# Patient Record
Sex: Female | Born: 1945 | Race: White | Hispanic: No | Smoking: Former smoker
Health system: Southern US, Community
[De-identification: ages and names within clinical notes are randomized; demographics above are authoritative.]

## PROBLEM LIST (undated history)

## (undated) DIAGNOSIS — E079 Disorder of thyroid, unspecified: Secondary | ICD-10-CM

## (undated) DIAGNOSIS — I209 Angina pectoris, unspecified: Secondary | ICD-10-CM

## (undated) DIAGNOSIS — I10 Essential (primary) hypertension: Secondary | ICD-10-CM

## (undated) DIAGNOSIS — E785 Hyperlipidemia, unspecified: Secondary | ICD-10-CM

## (undated) DIAGNOSIS — Z8719 Personal history of other diseases of the digestive system: Secondary | ICD-10-CM

## (undated) DIAGNOSIS — M1990 Unspecified osteoarthritis, unspecified site: Secondary | ICD-10-CM

## (undated) DIAGNOSIS — T8859XA Other complications of anesthesia, initial encounter: Secondary | ICD-10-CM

## (undated) DIAGNOSIS — I2510 Atherosclerotic heart disease of native coronary artery without angina pectoris: Secondary | ICD-10-CM

## (undated) DIAGNOSIS — Z720 Tobacco use: Secondary | ICD-10-CM

## (undated) HISTORY — PX: BREAST LUMPECTOMY: SHX2

## (undated) HISTORY — PX: CHOLECYSTECTOMY: SHX55

## (undated) HISTORY — PX: TONSILLECTOMY: SUR1361

## (undated) HISTORY — PX: TUBAL LIGATION: SHX77

---

## 1968-06-24 DIAGNOSIS — E079 Disorder of thyroid, unspecified: Secondary | ICD-10-CM

## 1968-06-24 HISTORY — DX: Disorder of thyroid, unspecified: E07.9

## 1981-06-24 HISTORY — PX: TUBAL LIGATION: SHX77

## 1986-06-24 HISTORY — PX: BREAST EXCISIONAL BIOPSY: SUR124

## 1992-06-24 HISTORY — PX: CHOLECYSTECTOMY: SHX55

## 2012-10-20 ENCOUNTER — Encounter (HOSPITAL_COMMUNITY): Payer: Self-pay | Admitting: Emergency Medicine

## 2012-10-20 ENCOUNTER — Emergency Department (HOSPITAL_COMMUNITY): Payer: Medicare Other

## 2012-10-20 ENCOUNTER — Observation Stay (HOSPITAL_COMMUNITY)
Admission: EM | Admit: 2012-10-20 | Discharge: 2012-10-22 | Disposition: A | Discharge status: Home / Self Care | Payer: Medicare Other | Attending: Internal Medicine | Admitting: Internal Medicine

## 2012-10-20 ENCOUNTER — Emergency Department (INDEPENDENT_AMBULATORY_CARE_PROVIDER_SITE_OTHER)
Admission: EM | Admit: 2012-10-20 | Discharge: 2012-10-20 | Disposition: A | Discharge status: Nursing Home (Includes ICF) | Payer: Medicare Other | Source: Home / Self Care | Attending: Family Medicine | Admitting: Family Medicine

## 2012-10-20 DIAGNOSIS — I2510 Atherosclerotic heart disease of native coronary artery without angina pectoris: Secondary | ICD-10-CM | POA: Diagnosis not present

## 2012-10-20 DIAGNOSIS — Z8249 Family history of ischemic heart disease and other diseases of the circulatory system: Secondary | ICD-10-CM | POA: Diagnosis not present

## 2012-10-20 DIAGNOSIS — R079 Chest pain, unspecified: Secondary | ICD-10-CM | POA: Diagnosis not present

## 2012-10-20 DIAGNOSIS — J449 Chronic obstructive pulmonary disease, unspecified: Secondary | ICD-10-CM | POA: Diagnosis not present

## 2012-10-20 DIAGNOSIS — J4489 Other specified chronic obstructive pulmonary disease: Secondary | ICD-10-CM | POA: Insufficient documentation

## 2012-10-20 DIAGNOSIS — E785 Hyperlipidemia, unspecified: Secondary | ICD-10-CM | POA: Insufficient documentation

## 2012-10-20 DIAGNOSIS — E669 Obesity, unspecified: Secondary | ICD-10-CM | POA: Diagnosis not present

## 2012-10-20 DIAGNOSIS — E049 Nontoxic goiter, unspecified: Secondary | ICD-10-CM | POA: Diagnosis not present

## 2012-10-20 DIAGNOSIS — I200 Unstable angina: Secondary | ICD-10-CM | POA: Diagnosis not present

## 2012-10-20 DIAGNOSIS — F17200 Nicotine dependence, unspecified, uncomplicated: Secondary | ICD-10-CM | POA: Diagnosis not present

## 2012-10-20 DIAGNOSIS — Z6835 Body mass index (BMI) 35.0-35.9, adult: Secondary | ICD-10-CM | POA: Diagnosis not present

## 2012-10-20 DIAGNOSIS — Z955 Presence of coronary angioplasty implant and graft: Secondary | ICD-10-CM

## 2012-10-20 DIAGNOSIS — I10 Essential (primary) hypertension: Secondary | ICD-10-CM | POA: Insufficient documentation

## 2012-10-20 HISTORY — DX: Tobacco use: Z72.0

## 2012-10-20 HISTORY — DX: Disorder of thyroid, unspecified: E07.9

## 2012-10-20 HISTORY — DX: Atherosclerotic heart disease of native coronary artery without angina pectoris: I25.10

## 2012-10-20 HISTORY — DX: Angina pectoris, unspecified: I20.9

## 2012-10-20 HISTORY — DX: Hyperlipidemia, unspecified: E78.5

## 2012-10-20 LAB — CBC
MCV: 86 fL (ref 78.0–100.0)
Platelets: 300 10*3/uL (ref 150–400)
RBC: 5.2 MIL/uL — ABNORMAL HIGH (ref 3.87–5.11)
WBC: 6.9 10*3/uL (ref 4.0–10.5)

## 2012-10-20 LAB — LIPID PANEL
Cholesterol: 216 mg/dL — ABNORMAL HIGH (ref 0–200)
Triglycerides: 202 mg/dL — ABNORMAL HIGH (ref <150)
VLDL: 40 mg/dL (ref 0–40)

## 2012-10-20 LAB — POCT I-STAT TROPONIN I: Troponin i, poc: 0 ng/mL (ref 0.00–0.08)

## 2012-10-20 LAB — BASIC METABOLIC PANEL
CO2: 28 mEq/L (ref 19–32)
Chloride: 100 mEq/L (ref 96–112)
GFR calc Af Amer: >90 mL/min (ref >90)
Potassium: 4.4 mEq/L (ref 3.5–5.1)
Sodium: 138 mEq/L (ref 135–145)

## 2012-10-20 LAB — TROPONIN I: Troponin I: <0.3 ng/mL (ref <0.30)

## 2012-10-20 MED ORDER — ENOXAPARIN SODIUM 40 MG/0.4ML ~~LOC~~ SOLN
40.0000 mg | SUBCUTANEOUS | Status: DC
  Administered 2012-10-20: 40 mg via SUBCUTANEOUS
  Filled 2012-10-20 (×2): qty 0.4

## 2012-10-20 MED ORDER — SODIUM CHLORIDE 0.9 % IJ SOLN
3.0000 mL | Freq: Two times a day (BID) | INTRAMUSCULAR | Status: DC
  Administered 2012-10-20: 3 mL via INTRAVENOUS

## 2012-10-20 MED ORDER — ASPIRIN EC 81 MG PO TBEC
81.0000 mg | DELAYED_RELEASE_TABLET | Freq: Every day | ORAL | Status: DC
  Administered 2012-10-22: 81 mg via ORAL
  Filled 2012-10-20 (×3): qty 1

## 2012-10-20 MED ORDER — ASPIRIN 81 MG PO CHEW
324.0000 mg | CHEWABLE_TABLET | Freq: Once | ORAL | Status: AC
  Administered 2012-10-20: 324 mg via ORAL
  Filled 2012-10-20: qty 1
  Filled 2012-10-20: qty 4

## 2012-10-20 MED ORDER — SODIUM CHLORIDE 0.9 % IV SOLN: 250.0000 mL | INTRAVENOUS | Status: DC | PRN

## 2012-10-20 MED ORDER — NITROGLYCERIN 0.4 MG SL SUBL: 0.4000 mg | SUBLINGUAL_TABLET | SUBLINGUAL | Status: DC | PRN

## 2012-10-20 MED ORDER — ONDANSETRON HCL 4 MG PO TABS: 4.0000 mg | ORAL_TABLET | Freq: Four times a day (QID) | ORAL | Status: DC | PRN

## 2012-10-20 MED ORDER — ATORVASTATIN CALCIUM 80 MG PO TABS
80.0000 mg | ORAL_TABLET | Freq: Every day | ORAL | Status: DC
  Administered 2012-10-20 – 2012-10-21 (×2): 80 mg via ORAL
  Filled 2012-10-20 (×4): qty 1

## 2012-10-20 MED ORDER — SODIUM CHLORIDE 0.9 % IJ SOLN
3.0000 mL | Freq: Two times a day (BID) | INTRAMUSCULAR | Status: DC
  Administered 2012-10-21: 3 mL via INTRAVENOUS

## 2012-10-20 MED ORDER — PANTOPRAZOLE SODIUM 40 MG PO TBEC: 40.0000 mg | DELAYED_RELEASE_TABLET | Freq: Every day | ORAL | Status: DC | PRN

## 2012-10-20 MED ORDER — SODIUM CHLORIDE 0.9 % IJ SOLN: 3.0000 mL | INTRAMUSCULAR | Status: DC | PRN

## 2012-10-20 MED ORDER — ONDANSETRON HCL 4 MG/2ML IJ SOLN: 4.0000 mg | Freq: Four times a day (QID) | INTRAMUSCULAR | Status: DC | PRN

## 2012-10-20 MED ORDER — MORPHINE SULFATE 2 MG/ML IJ SOLN: 1.0000 mg | INTRAMUSCULAR | Status: DC | PRN

## 2012-10-20 NOTE — ED Notes (Signed)
Pt c/o mid sternal CP; pt sent here for eval from [facility]: pt denies SOB or nausea

## 2012-10-20 NOTE — ED Notes (Signed)
MD at bedside. 

## 2012-10-20 NOTE — ED Provider Notes (Signed)
History     CSN: [id number]  Arrival date & time 10/20/12  1437   First MD Initiated Contact with Patient 10/20/12 1616      Chief Complaint  Patient presents with  . Chest Pain    (Consider location/radiation/quality/duration/timing/severity/associated sxs/prior treatment) HPI Comments: Patient presents from urgent care with a two-week history of intermittent substernal chest pain that radiates to her back and left arm. The pain comes and goes sometimes lasting for hours to days at a time. Is not sure, with exertion. Denies shortness of breath, cough or fever. No history of hypertension or diabetes. She is a smoker. Denies any cardiac history. Has never had a stress test. Does not have a regular doctor. Sibling had bypass at age 56. Nothing makes the pain better or worse. She was able to mow the grass and not have any chest pain.  The history is provided by the patient.    History reviewed. No pertinent past medical history.  History reviewed. No pertinent past surgical history.  History reviewed. No pertinent family history.  History  Substance Use Topics  . Smoking status: Current Every Day Smoker -- 1.00 packs/day for 40 years    Types: Cigarettes  . Smokeless tobacco: Not on file  . Alcohol Use: No    OB History   Grav Para Term Preterm Abortions TAB SAB Ect Mult Living                  Review of Systems  Constitutional: Negative for fever, activity change and appetite change.  Respiratory: Positive for chest tightness. Negative for cough and shortness of breath.   Cardiovascular: Positive for chest pain.  Gastrointestinal: Negative for nausea, vomiting and abdominal pain.  Genitourinary: Negative for dysuria and hematuria.  Musculoskeletal: Negative for back pain.  Skin: Negative for rash.  Neurological: Negative for dizziness and headaches.  A complete 10 system review of systems was obtained and all systems are negative except as noted in the HPI and PMH.     Allergies  Codeine  Home Medications   Current Outpatient Rx  Name  Route  Sig  Dispense  Refill  . naproxen sodium (ANAPROX) 220 MG tablet   Oral   Take 220 mg by mouth daily as needed. For pain           BP 150/92  Pulse 62  Temp(Src) 98 F (36.7 C) (Oral)  Resp 18  SpO2 98%  Physical Exam  Constitutional: She is oriented to person, place, and time. She appears well-developed and well-nourished. No distress.  HENT:  Head: Normocephalic and atraumatic.  Mouth/Throat: Oropharynx is clear and moist. No oropharyngeal exudate.  Eyes: Conjunctivae and EOM are normal. Pupils are equal, round, and reactive to light.  Neck: Normal range of motion. Neck supple.  Cardiovascular: Normal rate, regular rhythm, normal heart sounds and intact distal pulses.   No murmur heard. Equal peripheral pulses  Pulmonary/Chest: Effort normal and breath sounds normal. No respiratory distress.  Abdominal: Soft. There is no tenderness. There is no rebound and no guarding.  Musculoskeletal: Normal range of motion. She exhibits no edema and no tenderness.  Neurological: She is alert and oriented to person, place, and time. No cranial nerve deficit. She exhibits normal muscle tone. Coordination normal.  Skin: Skin is warm.    ED Course  Procedures (including critical care time)  Labs Reviewed  CBC - Abnormal; Notable for the following:    RBC 5.20 (*)    Hemoglobin 16.0 (*)  All other components within normal limits  BASIC METABOLIC PANEL - Abnormal; Notable for the following:    GFR calc non Af Amer 89 (*)    All other components within normal limits  POCT I-STAT TROPONIN I   Dg Chest 2 View  10/20/2012  *RADIOLOGY REPORT*  Clinical Data: Chest pain.  CHEST - 2 VIEW  Comparison: None  Findings: The cardiomediastinal silhouette is unremarkable. Mild peribronchial thickening / COPD is noted. There is no evidence of focal airspace disease, pulmonary edema, suspicious pulmonary  nodule/mass, pleural effusion, or pneumothorax. No acute bony abnormalities are identified.  IMPRESSION: No evidence of acute cardiopulmonary disease.  COPD.   Original Report Authenticated By: Harmon Pier, M.D.      1. Chest pain       MDM  2 weeks of intermittent substernal chest pain it radiates to the back and the arm. No cardiac history. Never had a stress test. Female gender with history of smoking and family history of early CAD.  EKG normal sinus rhythm. Troponin negative. Low suspicion for PE or dissection.  Concern for ACS based on age and risk factors. We'll admit to internal medicine service as unassigned patient   Date: 10/20/2012  Rate: 77  Rhythm: normal sinus rhythm  QRS Axis: normal  Intervals: normal  ST/T Wave abnormalities: normal  Conduction Disutrbances:none  Narrative Interpretation:   Old EKG Reviewed: none available        Glynn Octave, MD 10/20/12 1709

## 2012-10-20 NOTE — H&P (Signed)
Hospital Admission Note Date: 10/20/2012  Patient name: Cassandra Castillo Medical record number: [id number] Date of birth: [birth year] Age: 67 y.o. Gender: female PCP: No primary provider on file.  Medical Service: B [facility]  Attending physician:  Dr. Rogelia Boga  1st Contact:  Dr. Collier Bullock  Pager: [phone] 2nd Contact:  Dr. Dorise Hiss  Pager:[phone] After 5 pm or weekends: 1st Contact:      Pager: [phone] 2nd Contact:      Pager: [phone]  Chief Complaint: Chest pain/discomfort  History of Present Illness: The patient is a 67 year old lady with a history of hyperlipidemia, benign breast mass removed in 1992, and no interaction with the medical community in over 10 years, who presents with 2 weeks of chest pain/discomfort. She has not noticed any associated nausea, vomiting, shortness of breath, diaphoresis. There is no association with activity levels as the pain can occur at rest or at any random time. She was able to mow the grass a few days ago without any chest pressure at all. Patient describes the pain as "dull," "tightness" and "pressure" that is located across the front of her chest and radiates to her back and shoulder blades. Sometimes radiates to the left side. Patient feels like this pain is worsening and has become more frequent over the last 2 weeks. Yesterday she states that the pain was almost constant. Patient has tried Aleve for this problem without any relief. Patient does endorse several episodes of palpitations.  Patient has also noticed worsening indigestion over the past couple of weeks with burning epigastric pain and chest pain. She has tried Scientist, research (medical) for this problem and she states that the pain improves after using them. She feels this pain is different than the chest pain and she has been having.  Patient does not have a PCP and has not had regular medical care in over 10 years. She states that she doesn't like doctors and doesn't like taking medicines.  Family history significant  for a father who had severe coronary artery disease, had a four-vessel CABG at age 40 and a second 2 vessel CABG at age 63. He died of congestive heart failure 10 years After the CABG. Patient also has an uncle who had severe coronary artery disease also.  Patient smokes one pack per day and has done so for about 40 years. She denies any alcohol use.  Patient denies any dysuria, urinary frequency, diarrhea, recent illness or URI.  Meds: Current Outpatient Rx  Name  Route  Sig  Dispense  Refill  . naproxen sodium (ANAPROX) 220 MG tablet   Oral   Take 220 mg by mouth daily as needed. For pain           Allergies: Allergies as of 10/20/2012 - Review Complete 10/20/2012  Allergen Reaction Noted  . Codeine Nausea And Vomiting 10/20/2012   Past Medical History  Diagnosis Date  . S/P tonsillectomy   . S/P cholecystectomy   . S/P lumpectomy, left breast   . Hyperlipidemia   . S/P tubal ligation    History reviewed. No pertinent past surgical history. History reviewed. No pertinent family history. History   Social History  . Marital Status: Married    Spouse Name: N/A    Number of Children: N/A  . Years of Education: N/A   Occupational History  . Not on file.   Social History Main Topics  . Smoking status: Current Every Day Smoker -- 1.00 packs/day for 40 years    Types: Cigarettes  .  Smokeless tobacco: Not on file  . Alcohol Use: No  . Drug Use: No  . Sexually Active: Not on file   Other Topics Concern  . Not on file   Social History Narrative  . No narrative on file    Review of Systems: Pertinent items are noted in HPI.  Physical Exam Blood pressure 150/92, pulse 62, temperature 98 F (36.7 C), temperature source Oral, resp. rate 18, SpO2 98.00%. General:  Obese, No acute distress, alert and oriented x 3 HEENT:  PERRL, EOMI, moist mucous membranes, poor dentition, several missing teeth, oropharynx otherwise clear, no lesions Cardiovascular:  Regular rate  and rhythm, no murmurs, rubs or gallops. No carotid bruits heard Chest wall: Anterior chest wall is tender to palpation, feels different than her usual chest pain. Respiratory:  Clear to auscultation bilaterally, no wheezes, rales, or rhonchi, decreased breath sounds at the bases Abdomen:  Soft, nondistended, minimal tenderness in the right upper quadrant and epigastrium, bowel sounds present, no masses, no hepatosplenomegaly appreciated Extremities:  Warm and well-perfused, trace bilateral lower extremity edema.  Skin: Warm, dry, no rashes Neuro: Not anxious appearing, no depressed mood, normal affect    Lab results: Basic Metabolic Panel:  Recent Labs  16/10/96 1511  NA 138  K 4.4  CL 100  CO2 28  GLUCOSE 89  BUN 7  CREATININE 0.69  CALCIUM 9.8   CBC:  Recent Labs  10/20/12 1511  WBC 6.9  HGB 16.0*  HCT 44.7  MCV 86.0  PLT 300    Imaging results:  Dg Chest 2 View  10/20/2012  *RADIOLOGY REPORT*  Clinical Data: Chest pain.  CHEST - 2 VIEW  Comparison: None  Findings: The cardiomediastinal silhouette is unremarkable. Mild peribronchial thickening / COPD is noted. There is no evidence of focal airspace disease, pulmonary edema, suspicious pulmonary nodule/mass, pleural effusion, or pneumothorax. No acute bony abnormalities are identified.  IMPRESSION: No evidence of acute cardiopulmonary disease.  COPD.   Original Report Authenticated By: Harmon Pier, M.D.      Assessment & Plan by Problem: Active Problems:   Chest pain   Chest pain Patient presents with 2 weeks of chest pain, initial troponins negative, chest x-ray with mild peribronchial thickening but no acute abnormalities. EKG without acute ST elevations or depressions, no contiguous T-wave inversions. TIMI score of 4, correlating with a 20% risk of acute event in the next 30 days. Modified Geneva score is 1, low risk, wells score is 0. Patient does has risk factors including family history of CAD, smoking, and  possibly hypertension and hyperlipidemia though not documented. Has not seen an MD for preventative care in 20 years. Differential includes ACS, PE, musculoskeletal pain, anxiety, GERD, esophageal spasm, possible gastritis. Less likely PNA as CXR neg and no cough, fever, SOB. Another consideration is breast cancer as patient has hx of "benign" lump removed from breast without any follow up or mammograms. Dissection less likely as no widened mediastinum on CXR, equal pulses, and not acute pain. No recent vomiting to suggest Boerhaave's. No epigastric pain or nausea, so less likely pancreatitis. Will need risk stratification and establishing PCP.  -Admit to telemetry for observation -Daily aspirin, morphine PRN, oxygen PRN, NTG prn -Repeat EKG in the morning -Will call cardiology to evaluate patient for possible stress test -Will start Protonix 40 mg daily for indigestion -CMP, hemoglobin A1c, lipid panel, TSH -Cycle troponins overnight  DVT prophylaxis Lovenox  Diet N.p.o. after midnight, otherwise heart healthy diet  Disposition Anticipate discharge  in 1-2 days  Patient will need to establish care with a PCP and get routine medical care including mammogram, colonoscopy, blood pressure f/u. Patient does not have a PCP and may need OPC followup           Dispo: Disposition is deferred at this time, awaiting improvement of current medical problems. Anticipated discharge in approximately 1-2 day(s).   The patient does not have a current PCP, therefore may be requiring OPC follow-up after discharge.   The patient does not have transportation limitations that hinder transportation to clinic appointments.  SignedGenella Mech 10/20/2012, 6:20 PM

## 2012-10-20 NOTE — ED Notes (Signed)
Pt c/o chest pain x 2 weeks. Pain comes and goes and radiates out to chest and back. Has taken Aleeve with no relief. Last took Aleeve last night around 1130 pm. Pt is alert and oriented.

## 2012-10-20 NOTE — ED Provider Notes (Signed)
History     CSN: [id number]  Arrival date & time 10/20/12  1300   First MD Initiated Contact with Patient 10/20/12 1356      Chief Complaint  Patient presents with  . Chest Pain    (Consider location/radiation/quality/duration/timing/severity/associated sxs/prior treatment) Patient is a 67 y.o. female presenting with chest pain. The history is provided by the patient.  Chest Pain Pain location:  L chest Pain quality: aching and radiating   Pain radiates to:  L shoulder Pain radiates to the back: yes   Pain severity:  Moderate Onset quality:  Gradual Timing:  Intermittent Progression:  Waxing and waning Relieved by:  Nothing Worsened by:  Nothing tried Ineffective treatments:  None tried Associated symptoms: no abdominal pain   Pt has had chest pain on and off for 2 weeks.  Pain goes through to back.  Pt had pain until approx 20 minutes ago.  No regular MD.  No history of htn or diabetes  History reviewed. No pertinent past medical history.  History reviewed. No pertinent past surgical history.  No family history on file.  History  Substance Use Topics  . Smoking status: Current Every Day Smoker -- 1.00 packs/day for 40 years    Types: Cigarettes  . Smokeless tobacco: Not on file  . Alcohol Use: No    OB History   Grav Para Term Preterm Abortions TAB SAB Ect Mult Living                  Review of Systems  Cardiovascular: Positive for chest pain.  Gastrointestinal: Negative for abdominal pain.  All other systems reviewed and are negative.    Allergies  Codeine  Home Medications  No current outpatient prescriptions on file.  BP 162/80  Pulse 74  Temp(Src) 97.5 F (36.4 C) (Oral)  Resp 12  SpO2 97%  Physical Exam  Nursing note and vitals reviewed. Constitutional: She is oriented to person, place, and time. She appears well-developed and well-nourished.  HENT:  Head: Normocephalic and atraumatic.  Right Ear: External ear normal.  Left Ear:  External ear normal.  Nose: Nose normal.  Mouth/Throat: Oropharynx is clear and moist.  Eyes: Conjunctivae and EOM are normal. Pupils are equal, round, and reactive to light.  Neck: Normal range of motion. Neck supple.  Cardiovascular: Normal rate and normal heart sounds.   Pulmonary/Chest: Effort normal and breath sounds normal.  Abdominal: Soft. Bowel sounds are normal.  Musculoskeletal: Normal range of motion.  Neurological: She is alert and oriented to person, place, and time. She has normal reflexes.  Skin: Skin is warm.  Psychiatric: She has a normal mood and affect.    ED Course  Procedures (including critical care time)  Labs Reviewed - No data to display No results found.   1. Chest pain       MDM   Date: 10/20/2012  Rate: 61  Rhythm: normal sinus rhythm  QRS Axis: normal  Intervals: normal  ST/T Wave abnormalities: normal  Conduction Disutrbances:none  Narrative Interpretation:   Old EKG Reviewed: none available  Pt to Ed.  I advised nurse first RN        Elson Areas, PA-C 10/20/12 1414

## 2012-10-21 ENCOUNTER — Encounter (HOSPITAL_COMMUNITY)
Admission: EM | Disposition: A | Discharge status: Home / Self Care | Payer: Self-pay | Source: Home / Self Care | Attending: Internal Medicine

## 2012-10-21 ENCOUNTER — Encounter (HOSPITAL_COMMUNITY): Payer: Self-pay | Admitting: Internal Medicine

## 2012-10-21 DIAGNOSIS — R079 Chest pain, unspecified: Secondary | ICD-10-CM | POA: Diagnosis not present

## 2012-10-21 DIAGNOSIS — J449 Chronic obstructive pulmonary disease, unspecified: Secondary | ICD-10-CM | POA: Diagnosis not present

## 2012-10-21 DIAGNOSIS — I200 Unstable angina: Secondary | ICD-10-CM | POA: Diagnosis not present

## 2012-10-21 DIAGNOSIS — R9430 Abnormal result of cardiovascular function study, unspecified: Secondary | ICD-10-CM | POA: Diagnosis not present

## 2012-10-21 DIAGNOSIS — E785 Hyperlipidemia, unspecified: Secondary | ICD-10-CM | POA: Diagnosis not present

## 2012-10-21 DIAGNOSIS — I2510 Atherosclerotic heart disease of native coronary artery without angina pectoris: Secondary | ICD-10-CM | POA: Diagnosis not present

## 2012-10-21 HISTORY — PX: LEFT HEART CATHETERIZATION WITH CORONARY ANGIOGRAM: SHX5451

## 2012-10-21 LAB — CBC
MCH: 30.7 pg (ref 26.0–34.0)
MCH: 30.2 pg (ref 26.0–34.0)
MCHC: 35.6 g/dL (ref 30.0–36.0)
MCHC: 35 g/dL (ref 30.0–36.0)
MCV: 86.2 fL (ref 78.0–100.0)
MCV: 86.4 fL (ref 78.0–100.0)
Platelets: 262 10*3/uL (ref 150–400)
Platelets: 268 10*3/uL (ref 150–400)
RBC: 4.56 MIL/uL (ref 3.87–5.11)
RDW: 13.3 % (ref 11.5–15.5)

## 2012-10-21 LAB — HEMOGLOBIN A1C: Mean Plasma Glucose: 108 mg/dL (ref <117)

## 2012-10-21 LAB — COMPREHENSIVE METABOLIC PANEL
ALT: 15 U/L (ref 0–35)
AST: 22 U/L (ref 0–37)
Albumin: 3.7 g/dL (ref 3.5–5.2)
Alkaline Phosphatase: 75 U/L (ref 39–117)
CO2: 29 mEq/L (ref 19–32)
Chloride: 102 mEq/L (ref 96–112)
GFR calc non Af Amer: 86 mL/min — ABNORMAL LOW (ref >90)
Potassium: 3.7 mEq/L (ref 3.5–5.1)
Sodium: 138 mEq/L (ref 135–145)
Total Bilirubin: 0.4 mg/dL (ref 0.3–1.2)

## 2012-10-21 LAB — CREATININE, SERUM: Creatinine, Ser: 0.68 mg/dL (ref 0.50–1.10)

## 2012-10-21 LAB — PROTIME-INR
INR: 0.98 (ref 0.00–1.49)
Prothrombin Time: 12.9 seconds (ref 11.6–15.2)

## 2012-10-21 LAB — TSH: TSH: 3.192 u[IU]/mL (ref 0.350–4.500)

## 2012-10-21 SURGERY — LEFT HEART CATHETERIZATION WITH CORONARY ANGIOGRAM
Anesthesia: LOCAL

## 2012-10-21 MED ORDER — SODIUM CHLORIDE 0.9 % IV SOLN
INTRAVENOUS | Status: DC
Start: 2012-10-21 — End: 2012-10-21
  Administered 2012-10-21: 11:00:00 via INTRAVENOUS

## 2012-10-21 MED ORDER — ACETAMINOPHEN 325 MG PO TABS: 650.0000 mg | ORAL_TABLET | ORAL | Status: DC | PRN

## 2012-10-21 MED ORDER — CLOPIDOGREL BISULFATE 75 MG PO TABS
75.0000 mg | ORAL_TABLET | Freq: Every day | ORAL | Status: DC
  Administered 2012-10-22: 75 mg via ORAL
  Filled 2012-10-21: qty 1

## 2012-10-21 MED ORDER — ENOXAPARIN SODIUM 40 MG/0.4ML ~~LOC~~ SOLN
40.0000 mg | SUBCUTANEOUS | Status: DC
  Filled 2012-10-21 (×2): qty 0.4

## 2012-10-21 MED ORDER — NITROGLYCERIN 1 MG/10 ML FOR IR/CATH LAB
INTRA_ARTERIAL | Status: AC
  Filled 2012-10-21: qty 10

## 2012-10-21 MED ORDER — VERAPAMIL HCL 2.5 MG/ML IV SOLN
INTRAVENOUS | Status: AC
  Filled 2012-10-21: qty 2

## 2012-10-21 MED ORDER — MIDAZOLAM HCL 2 MG/2ML IJ SOLN
INTRAMUSCULAR | Status: AC
  Filled 2012-10-21: qty 2

## 2012-10-21 MED ORDER — FENTANYL CITRATE 0.05 MG/ML IJ SOLN
INTRAMUSCULAR | Status: AC
  Filled 2012-10-21: qty 2

## 2012-10-21 MED ORDER — SODIUM CHLORIDE 0.9 % IV SOLN: INTRAVENOUS | Status: AC

## 2012-10-21 MED ORDER — METOPROLOL TARTRATE 25 MG PO TABS
25.0000 mg | ORAL_TABLET | Freq: Two times a day (BID) | ORAL | Status: DC
  Administered 2012-10-21 – 2012-10-22 (×2): 25 mg via ORAL
  Filled 2012-10-21 (×3): qty 1

## 2012-10-21 MED ORDER — HEPARIN (PORCINE) IN NACL 2-0.9 UNIT/ML-% IJ SOLN
INTRAMUSCULAR | Status: AC
  Filled 2012-10-21: qty 1000

## 2012-10-21 MED ORDER — ASPIRIN 81 MG PO CHEW
324.0000 mg | CHEWABLE_TABLET | Freq: Once | ORAL | Status: AC
  Administered 2012-10-21: 324 mg via ORAL
  Filled 2012-10-21: qty 4

## 2012-10-21 MED ORDER — LIDOCAINE HCL (PF) 1 % IJ SOLN
INTRAMUSCULAR | Status: AC
  Filled 2012-10-21: qty 30

## 2012-10-21 MED ORDER — HEPARIN SODIUM (PORCINE) 1000 UNIT/ML IJ SOLN
INTRAMUSCULAR | Status: AC
  Filled 2012-10-21: qty 1

## 2012-10-21 MED ORDER — CLOPIDOGREL BISULFATE 300 MG PO TABS
ORAL_TABLET | ORAL | Status: AC
  Filled 2012-10-21: qty 2

## 2012-10-21 NOTE — H&P (View-Only) (Signed)
CARDIOLOGY CONSULT NOTE  [name], MRN: [id number], DOB/AGE: [birth year] 67 y.o. Admit date: 10/20/2012   Date of Consult: 10/21/2012 Primary Physician: No primary provider on file. Primary Cardiologist: New to [facility]  Chief Complaint: chest pain Reason for Consult: chest pain, cardiac risk factors  HPI: Cassandra Castillo is a 67 y/o F with history of HL, 40-yrs tobacco abuse, prior thyroid disease, family history of CAD with no prior cardiac history who presented to [facility] with chest pain. For the past few weeks she has been experiencing intermittent substernal chest pressure with occasional radiation to her back and arms without any particular trigger. It is not made worse with inspiration, palpation, exertion, or meals. Over the last week it has become more constant where sometimes it lasts all day. On Monday she had >8 hours of pain. This actually improved when she mowed the lawn with the push mower later in the day. It is not associated with any nausea, vomiting, SOB, palpitations, or syncope. She did have short-lived clamminess on one occasion. Yesterday her chest pressure was intermittent. Her daughter is an OB/GYN doctor at [employer] [facility] [employer] and has been insisting she seek medical care so the patient came to the ER. Troponin neg x 3 thus far, LFTs WNL, TSH WNL, EKG with COPD but no evidence of acute CP disease. EKG without acute changes.  Past Medical History  Diagnosis Date  . Hyperlipidemia   . Tobacco abuse   . Thyroid disease     Has had to intermittently take thyroid medicine in the past      Most Recent Cardiac Studies: None   Surgical History:  Past Surgical History  Procedure Laterality Date  . Tonsillectomy    . Breast lumpectomy    . Cholecystectomy    . Tubal ligation       Home Meds: Prior to Admission medications   Medication Sig Start Date End Date Taking? Authorizing Provider  naproxen sodium (ANAPROX) 220 MG tablet Take 220 mg by mouth daily as  needed. For pain   Yes Historical Provider, MD    Inpatient Medications:  . aspirin EC  81 mg Oral Daily  . atorvastatin  80 mg Oral q1800  . enoxaparin (LOVENOX) injection  40 mg Subcutaneous Q24H  . sodium chloride  3 mL Intravenous Q12H  . sodium chloride  3 mL Intravenous Q12H    Allergies:  Allergies  Allergen Reactions  . Codeine Nausea And Vomiting    History   Social History  . Marital Status: Married    Spouse Name: N/A    Number of Children: N/A  . Years of Education: N/A   Occupational History  . Not on file.   Social History Main Topics  . Smoking status: Current Every Day Smoker -- 1.00 packs/day for 40 years    Types: Cigarettes  . Smokeless tobacco: Not on file  . Alcohol Use: No  . Drug Use: No  . Sexually Active: Not on file   Other Topics Concern  . Not on file   Social History Narrative  . No narrative on file     Family History  Problem Relation Age of Onset  . Coronary artery disease Father 55    CABG x4 at age 58, repeat 2 vessel CABG at age 55. Died of congestive heart failure 10 years later  . Coronary artery disease Paternal Uncle   . Hyperlipidemia Brother      Review of Systems: General: negative for chills,  fever, night sweats or weight changes.  Cardiovascular: negative for edema, orthopnea, palpitations, paroxysmal nocturnal dyspnea, shortness of breath or dyspnea on exertion Dermatological: negative for rash Respiratory: negative for cough or wheezing Urologic: negative for hematuria Abdominal: negative for nausea, vomiting, diarrhea, bright red blood per rectum, melena, or hematemesis Neurologic: negative for visual changes, syncope, or dizziness All other systems reviewed and are otherwise negative except as noted above.  Labs:  Recent Labs  10/20/12 2032 10/21/12 0153  TROPONINI <0.30 <0.30   Lab Results  Component Value Date   WBC 7.4 10/21/2012   HGB 14.0 10/21/2012   HCT 39.3 10/21/2012   MCV 86.2 10/21/2012    PLT 262 10/21/2012     Recent Labs Lab 10/21/12 0153  NA 138  K 3.7  CL 102  CO2 29  BUN 12  CREATININE 0.77  CALCIUM 9.0  PROT 6.4  BILITOT 0.4  ALKPHOS 75  ALT 15  AST 22  GLUCOSE 104*   Lab Results  Component Value Date   CHOL 216* 10/20/2012   HDL 33* 10/20/2012   LDLCALC 143* 10/20/2012   TRIG 202* 10/20/2012   Radiology/Studies:  Dg Chest 2 View 10/20/2012  *RADIOLOGY REPORT*  Clinical Data: Chest pain.  CHEST - 2 VIEW  Comparison: None  Findings: The cardiomediastinal silhouette is unremarkable. Mild peribronchial thickening / COPD is noted. There is no evidence of focal airspace disease, pulmonary edema, suspicious pulmonary nodule/mass, pleural effusion, or pneumothorax. No acute bony abnormalities are identified.  IMPRESSION: No evidence of acute cardiopulmonary disease.  COPD.   Original Report Authenticated By: Harmon Pier, M.D.    EKG:  4/29: NSR 77bpm TWI avL, otherwise nonacute 4/29: NSR 61bpm TWI flattening avL, otherwise nonacute 4/30: sinus bradycardia 57bpm TW inversion avL, V2 otherwise nonacute  Physical Exam: Blood pressure 136/70, pulse 56, temperature 98.1 F (36.7 C), temperature source Oral, resp. rate 18, height 5\' 4"  (1.626 m), weight 211 lb 6.4 oz (95.89 kg), SpO2 98.00%. General: Well developed, well nourished WF in no acute distress. Head: Normocephalic, atraumatic, sclera non-icteric, no xanthomas, nares are without discharge.  Neck: Negative for carotid bruits. JVD not elevated. Thyromegaly noted with R>L thyroid enlargement. Lungs: Diminished BS at bases. Otherwise clear bilaterally to auscultation without wheezes, rales, or rhonchi. Breathing is unlabored. Heart: RRR with S1 S2. No murmurs, rubs, or gallops appreciated. Abdomen: Soft, non-tender, non-distended with normoactive bowel sounds. No hepatomegaly. No rebound/guarding. No obvious abdominal masses. Msk:  Strength and tone appear normal for age. Extremities: No clubbing or cyanosis. No  edema.  Distal pedal pulses are 2+ and equal bilaterally. Neuro: Alert and oriented X 3. No facial asymmetry. No focal deficit. Moves all extremities spontaneously. Psych:  Responds to questions appropriately with a normal affect.   Assessment and Plan:  1. Chest pain, atypical - enzymes negative and EKG nonacute but needs further evaluation given risk factors. Given cardiac risk factors, will plan for ETT and echocardiogram today. See below for additional thoughts. 2. Hyperlipidemia - with history of tobacco use and family history, consider statin initiation. 3. Tobacco abuse - counseled regarding cessation. 4. Thyromegaly - will defer to IM. May need thyroid ultrasound.  Signed, Ronie Spies PA-C 10/21/2012, 7:34 AM  Patient examined chart reviewed Given body habitus and normal ECG  ETT will be best test Heart Score 4  R/O Can be discharged if ETT is normal Will try to expidite.  Thyroid may be enlarged But no palpable nodule  Charlton Haws  ADDENDUM: ETT performed.  Pt exercised just over 3 minutes. HR quickly increased into 160s with inferolateral ST depression. No CP but patient limited by dyspnea. Max BP 208/71 after just a short time of exercise, indicating some degree of HTN. She had transient chest pressure post-procedure that resolved quickly as BP came down. Tracings reviewed with Dr. Eden Emms - plan cardiac cath this afternoon. Risks and benefits of cardiac catheterization have been discussed with the patient. These include bleeding, infection, kidney damage, stroke, heart attack, death. The patient understands these risks and is willing to proceed. Keep NPO. Cancel 2D echo. Cath lab team made aware patient is sensitive to sedating medicines per her report. She is pain free now and EKG returned to baseline. Dr. Eden Emms attempted to call patient's daughter with phone number provided by patient but line was busy. The patient will try to locate a better number to call her at. I also talked to  Dr. Collier Bullock with teaching service to keep her updated and they will also try to contact her daughter. Dayna Dunn PA-C  Reviewed stress test 1mm at times horizontal ST segment depression in inferior lateral leads.  Given body hapitus further non invasive testing not reliable. Favor cath for definitive diagnosis. Risks discussed patient willing to proceed  Charlton Haws

## 2012-10-21 NOTE — Interval H&P Note (Signed)
History and Physical Interval Note:  10/21/2012 2:41 PM  Donnah Levert  has presented today for surgery, with the diagnosis of cp  The various methods of treatment have been discussed with the patient and family. After consideration of risks, benefits and other options for treatment, the patient has consented to  Procedure(s): LEFT HEART CATHETERIZATION WITH CORONARY ANGIOGRAM (N/A) as a surgical intervention .  The patient's history has been reviewed, patient examined, no change in status, stable for surgery.  I have reviewed the patient's chart and labs.  Questions were answered to the patient's satisfaction.     Lorine Bears

## 2012-10-21 NOTE — Consult Note (Addendum)
 CARDIOLOGY CONSULT NOTE  [name], MRN: [id number], DOB/AGE: 66/27/1947 66 y.o. Admit date: 10/20/2012   Date of Consult: 10/21/2012 Primary Physician: No primary provider on file. Primary Cardiologist: New to [facility]  Chief Complaint: chest pain Reason for Consult: chest pain, cardiac risk factors  HPI: Cassandra Castillo is a 66 y/o F with history of HL, 40-yrs tobacco abuse, prior thyroid disease, family history of CAD with no prior cardiac history who presented to [facility] with chest pain. For the past few weeks she has been experiencing intermittent substernal chest pressure with occasional radiation to her back and arms without any particular trigger. It is not made worse with inspiration, palpation, exertion, or meals. Over the last week it has become more constant where sometimes it lasts all day. On Monday she had >8 hours of pain. This actually improved when she mowed the lawn with the push mower later in the day. It is not associated with any nausea, vomiting, SOB, palpitations, or syncope. She did have short-lived clamminess on one occasion. Yesterday her chest pressure was intermittent. Her daughter is an OB/GYN doctor at [facility] and has been insisting she seek medical care so the patient came to the ER. Troponin neg x 3 thus far, LFTs WNL, TSH WNL, EKG with COPD but no evidence of acute CP disease. EKG without acute changes.  Past Medical History  Diagnosis Date  . Hyperlipidemia   . Tobacco abuse   . Thyroid disease     Has had to intermittently take thyroid medicine in the past      Most Recent Cardiac Studies: None   Surgical History:  Past Surgical History  Procedure Laterality Date  . Tonsillectomy    . Breast lumpectomy    . Cholecystectomy    . Tubal ligation       Home Meds: Prior to Admission medications   Medication Sig Start Date End Date Taking? Authorizing Provider  naproxen sodium (ANAPROX) 220 MG tablet Take 220 mg by mouth daily as  needed. For pain   Yes Historical Provider, MD    Inpatient Medications:  . aspirin EC  81 mg Oral Daily  . atorvastatin  80 mg Oral q1800  . enoxaparin (LOVENOX) injection  40 mg Subcutaneous Q24H  . sodium chloride  3 mL Intravenous Q12H  . sodium chloride  3 mL Intravenous Q12H    Allergies:  Allergies  Allergen Reactions  . Codeine Nausea And Vomiting    History   Social History  . Marital Status: Married    Spouse Name: N/A    Number of Children: N/A  . Years of Education: N/A   Occupational History  . Not on file.   Social History Main Topics  . Smoking status: Current Every Day Smoker -- 1.00 packs/day for 40 years    Types: Cigarettes  . Smokeless tobacco: Not on file  . Alcohol Use: No  . Drug Use: No  . Sexually Active: Not on file   Other Topics Concern  . Not on file   Social History Narrative  . No narrative on file     Family History  Problem Relation Age of Onset  . Coronary artery disease Father 62    CABG x4 at age 62, repeat 2 vessel CABG at age 73. Died of congestive heart failure 10 years later  . Coronary artery disease Paternal Uncle   . Hyperlipidemia Brother      Review of Systems: General: negative for chills,   fever, night sweats or weight changes.  Cardiovascular: negative for edema, orthopnea, palpitations, paroxysmal nocturnal dyspnea, shortness of breath or dyspnea on exertion Dermatological: negative for rash Respiratory: negative for cough or wheezing Urologic: negative for hematuria Abdominal: negative for nausea, vomiting, diarrhea, bright red blood per rectum, melena, or hematemesis Neurologic: negative for visual changes, syncope, or dizziness All other systems reviewed and are otherwise negative except as noted above.  Labs:  Recent Labs  10/20/12 2032 10/21/12 0153  TROPONINI <0.30 <0.30   Lab Results  Component Value Date   WBC 7.4 10/21/2012   HGB 14.0 10/21/2012   HCT 39.3 10/21/2012   MCV 86.2 10/21/2012    PLT 262 10/21/2012     Recent Labs Lab 10/21/12 0153  NA 138  K 3.7  CL 102  CO2 29  BUN 12  CREATININE 0.77  CALCIUM 9.0  PROT 6.4  BILITOT 0.4  ALKPHOS 75  ALT 15  AST 22  GLUCOSE 104*   Lab Results  Component Value Date   CHOL 216* 10/20/2012   HDL 33* 10/20/2012   LDLCALC 143* 10/20/2012   TRIG 202* 10/20/2012   Radiology/Studies:  Dg Chest 2 View 10/20/2012  *RADIOLOGY REPORT*  Clinical Data: Chest pain.  CHEST - 2 VIEW  Comparison: None  Findings: The cardiomediastinal silhouette is unremarkable. Mild peribronchial thickening / COPD is noted. There is no evidence of focal airspace disease, pulmonary edema, suspicious pulmonary nodule/mass, pleural effusion, or pneumothorax. No acute bony abnormalities are identified.  IMPRESSION: No evidence of acute cardiopulmonary disease.  COPD.   Original Report Authenticated By: Jeffrey Hu, M.D.    EKG:  4/29: NSR 77bpm TWI avL, otherwise nonacute 4/29: NSR 61bpm TWI flattening avL, otherwise nonacute 4/30: sinus bradycardia 57bpm TW inversion avL, V2 otherwise nonacute  Physical Exam: Blood pressure 136/70, pulse 56, temperature 98.1 F (36.7 C), temperature source Oral, resp. rate 18, height 5' 4" (1.626 m), weight 211 lb 6.4 oz (95.89 kg), SpO2 98.00%. General: Well developed, well nourished WF in no acute distress. Head: Normocephalic, atraumatic, sclera non-icteric, no xanthomas, nares are without discharge.  Neck: Negative for carotid bruits. JVD not elevated. Thyromegaly noted with R>L thyroid enlargement. Lungs: Diminished BS at bases. Otherwise clear bilaterally to auscultation without wheezes, rales, or rhonchi. Breathing is unlabored. Heart: RRR with S1 S2. No murmurs, rubs, or gallops appreciated. Abdomen: Soft, non-tender, non-distended with normoactive bowel sounds. No hepatomegaly. No rebound/guarding. No obvious abdominal masses. Msk:  Strength and tone appear normal for age. Extremities: No clubbing or cyanosis. No  edema.  Distal pedal pulses are 2+ and equal bilaterally. Neuro: Alert and oriented X 3. No facial asymmetry. No focal deficit. Moves all extremities spontaneously. Psych:  Responds to questions appropriately with a normal affect.   Assessment and Plan:  1. Chest pain, atypical - enzymes negative and EKG nonacute but needs further evaluation given risk factors. Given cardiac risk factors, will plan for ETT and echocardiogram today. See below for additional thoughts. 2. Hyperlipidemia - with history of tobacco use and family history, consider statin initiation. 3. Tobacco abuse - counseled regarding cessation. 4. Thyromegaly - will defer to IM. May need thyroid ultrasound.  Signed, Dayna Dunn PA-C 10/21/2012, 7:34 AM  Patient examined chart reviewed Given body habitus and normal ECG  ETT will be best test Heart Score 4  R/O Can be discharged if ETT is normal Will try to expidite.  Thyroid may be enlarged But no palpable nodule  Cassandra Castillo  ADDENDUM: ETT performed.   Pt exercised just over 3 minutes. HR quickly increased into 160s with inferolateral ST depression. No CP but patient limited by dyspnea. Max BP 208/71 after just a short time of exercise, indicating some degree of HTN. She had transient chest pressure post-procedure that resolved quickly as BP came down. Tracings reviewed with Dr. Nishan - plan cardiac cath this afternoon. Risks and benefits of cardiac catheterization have been discussed with the patient. These include bleeding, infection, kidney damage, stroke, heart attack, death. The patient understands these risks and is willing to proceed. Keep NPO. Cancel 2D echo. Cath lab team made aware patient is sensitive to sedating medicines per her report. She is pain free now and EKG returned to baseline. Dr. Nishan attempted to call patient's daughter with phone number provided by patient but line was busy. The patient will try to locate a better number to call her at. I also talked to  Dr. Kesty with teaching service to keep her updated and they will also try to contact her daughter. Dayna Dunn PA-C  Reviewed stress test 1mm at times horizontal ST segment depression in inferior lateral leads.  Given body hapitus further non invasive testing not reliable. Favor cath for definitive diagnosis. Risks discussed patient willing to proceed  Cassandra Castillo      

## 2012-10-21 NOTE — Progress Notes (Signed)
Subjective: Patient failed stress test this morning, STD in inferolateral leads. Plan for cath this afternoon per cardiology.  Patint states that her chest is about the same today. She denies SOB, N, V.  Objective: Vital signs in last 24 hours: Filed Vitals:   10/20/12 1915 10/20/12 1930 10/20/12 2030 10/21/12 0500  BP: 131/70 123/78 134/64 136/70  Pulse: 56 68 68 56  Temp:   98.7 F (37.1 C) 98.1 F (36.7 C)  TempSrc:   Oral   Resp: 19 19  18   Height:   5\' 4"  (1.626 m)   Weight:   211 lb 6.4 oz (95.89 kg)   SpO2: 97% 97% 96% 98%   Weight change:   Intake/Output Summary (Last 24 hours) at 10/21/12 1116 Last data filed at 10/21/12 0900  Gross per 24 hour  Intake      3 ml  Output      0 ml  Net      3 ml    Physical Exam Blood pressure 136/70, pulse 56, temperature 98.1 F (36.7 C), temperature source Oral, resp. rate 18, height 5\' 4"  (1.626 m), weight 211 lb 6.4 oz (95.89 kg), SpO2 98.00%. General: Obese, No acute distress, alert and oriented x 3  HEENT: PERRL, EOMI, moist mucous membranes, poor dentition, several missing teeth, oropharynx otherwise clear, no lesions. Thyromegaly noted, no palpated nodules. Cardiovascular: Regular rate and rhythm, no murmurs, rubs or gallops. No carotid bruits heard  Respiratory: Clear to auscultation bilaterally, no wheezes, rales, or rhonchi, decreased breath sounds at the bases  Abdomen: Soft, nondistended, nontender, bowel sounds present, no masses, no hepatosplenomegaly appreciated  Extremities: Warm and well-perfused, trace bilateral lower extremity edema.  Skin: Warm, dry, no rashes  Neuro: Not anxious appearing, no depressed mood, normal affect   Lab Results: Basic Metabolic Panel:  Recent Labs Lab 10/20/12 1511 10/21/12 0153  NA 138 138  K 4.4 3.7  CL 100 102  CO2 28 29  GLUCOSE 89 104*  BUN 7 12  CREATININE 0.69 0.77  CALCIUM 9.8 9.0   Liver Function Tests:  Recent Labs Lab 10/21/12 0153  AST 22  ALT 15   ALKPHOS 75  BILITOT 0.4  PROT 6.4  ALBUMIN 3.7   CBC:  Recent Labs Lab 10/20/12 1511 10/21/12 0153  WBC 6.9 7.4  HGB 16.0* 14.0  HCT 44.7 39.3  MCV 86.0 86.2  PLT 300 262   Cardiac Enzymes:  Recent Labs Lab 10/20/12 2032 10/21/12 0153 10/21/12 0810  TROPONINI <0.30 <0.30 <0.30   Hemoglobin A1C:  Recent Labs Lab 10/20/12 2033  HGBA1C 5.4   Fasting Lipid Panel:  Recent Labs Lab 10/20/12 2032  CHOL 216*  HDL 33*  LDLCALC 143*  TRIG 202*  CHOLHDL 6.5   Thyroid Function Tests:  Recent Labs Lab 10/20/12 2033  TSH 3.192   Studies/Results: Dg Chest 2 View  10/20/2012  *RADIOLOGY REPORT*  Clinical Data: Chest pain.  CHEST - 2 VIEW  Comparison: None  Findings: The cardiomediastinal silhouette is unremarkable. Mild peribronchial thickening / COPD is noted. There is no evidence of focal airspace disease, pulmonary edema, suspicious pulmonary nodule/mass, pleural effusion, or pneumothorax. No acute bony abnormalities are identified.  IMPRESSION: No evidence of acute cardiopulmonary disease.  COPD.   Original Report Authenticated By: Harmon Pier, M.D.    Medications:  Medications reviewed  Scheduled Meds: . aspirin EC  81 mg Oral Daily  . atorvastatin  80 mg Oral q1800  . enoxaparin (LOVENOX) injection  40 mg  Subcutaneous Q24H  . sodium chloride  3 mL Intravenous Q12H  . sodium chloride  3 mL Intravenous Q12H   Continuous Infusions: . sodium chloride 100 mL/hr at 10/21/12 1101   PRN Meds:.sodium chloride, morphine injection, nitroGLYCERIN, ondansetron (ZOFRAN) IV, ondansetron, pantoprazole, sodium chloride  Assessment/Plan:  Chest pain with abnormal stress test 2 weeks duration. Patient failed stress test today, will plan for cath this afternoon per cardiology. Appreciate input. Troponins negative x3. LDL 143, HDL 33. Hgb A1c wnl.  -cont ASA, statin, PRN ntg and morphine -plan for cath this afternoon -risk stratification, will need follow up as  outpatient -smoking cessation counseling  Hyperlipidemia LDL 143, HDL 33 -80mg  lipitor started last night, continue  Hypertension BP elevated 150s/70 on admission, untreated. -will likely need to start antihypertensives, will wait until cath results  Thyromegaly noted on exam TSH is wnl -will need further work up incl T3, T4, ultrasound  DVT prophylaxis  Lovenox   Diet  N.p.o. Until after cath today  Disposition  Anticipate discharge in 1-2 days  Patient will need to establish care with a PCP and get routine medical care including mammogram, colonoscopy, blood pressure f/u, smoking cessation Patient does not have a PCP and may need OPC followup    LOS: 1 day   [place] Ar 10/21/2012, 11:16 AM

## 2012-10-21 NOTE — H&P (Signed)
Internal Medicine Teaching Service Attending Note Date: 10/21/2012  Patient name: Cassandra Castillo  Medical record number: [id number]  Date of birth: Nov 02, 1945   I have seen and evaluated Cassandra Castillo and discussed their care with the Residency Team. Please see Dr Vinnie Level H&P for full details. Cassandra Castillo is a 67 yo who doesn't see a PCP or any physician. She was in usual state of health until a couple of months ago when she bought some Tums bc of indigestion in the epigastric region. She then, about two weeks ago, had discomfort in her substernal area that was difficult for her to describe but felt like a balloon was being inflated in her chest. It comes and goes. When it starts it doesn't go away - can last anywhere from 1 hour to 1 full day. It has been increasing in freq and intensity last week. Can go between shoulder blades, L shoulder and neck. She was able to mow her lawn with a push lawn mower without discomfort.   She has no PCP and receives no medical care. She lives with her husband. Her daughter is OB GYN at [employer].  Physical Exam: Blood pressure 136/70, pulse 56, temperature 98.1 F (36.7 C), temperature source Oral, resp. rate 18, height 5\' 4"  (1.626 m), weight 207 lb 6.4 oz (94.076 kg), SpO2 98.00%. NAD, lying in bed, pleasant, able to speak in full sentences. HEENT [place] AT, poor and missing teeth, sclera clear, EOMI HRRR no MRG. Mild tenderness to palp over sternal area. LCTAB ABD + BS, mild epigastric tenderness Ext mild edema B Neuro : no focal, moves all four Skin : no rahes  Lab results: Results for orders placed during the hospital encounter of 10/20/12 (from the past 24 hour(s))  CBC     Status: Abnormal   Collection Time    10/20/12  3:11 PM      Result Value Range   WBC 6.9  4.0 - 10.5 K/uL   RBC 5.20 (*) 3.87 - 5.11 MIL/uL   Hemoglobin 16.0 (*) 12.0 - 15.0 g/dL   HCT 44.0  10.2 - 72.5 %   MCV 86.0  78.0 - 100.0 fL   MCH 30.8  26.0 - 34.0 pg   MCHC 35.8  30.0 - 36.0 g/dL    RDW 36.6  44.0 - 34.7 %   Platelets 300  150 - 400 K/uL  BASIC METABOLIC PANEL     Status: Abnormal   Collection Time    10/20/12  3:11 PM      Result Value Range   Sodium 138  135 - 145 mEq/L   Potassium 4.4  3.5 - 5.1 mEq/L   Chloride 100  96 - 112 mEq/L   CO2 28  19 - 32 mEq/L   Glucose, Bld 89  70 - 99 mg/dL   BUN 7  6 - 23 mg/dL   Creatinine, Ser 4.25  0.50 - 1.10 mg/dL   Calcium 9.8  8.4 - 95.6 mg/dL   GFR calc non Af Amer 89 (*) >90 mL/min   GFR calc Af Amer >90  >90 mL/min  POCT I-STAT TROPONIN I     Status: None   Collection Time    10/20/12  3:33 PM      Result Value Range   Troponin i, poc 0.00  0.00 - 0.08 ng/mL   Comment 3           LIPID PANEL     Status: Abnormal   Collection Time  10/20/12  8:32 PM      Result Value Range   Cholesterol 216 (*) 0 - 200 mg/dL   Triglycerides 540 (*) <150 mg/dL   HDL 33 (*) >98 mg/dL   Total CHOL/HDL Ratio 6.5     VLDL 40  0 - 40 mg/dL   LDL Cholesterol 119 (*) 0 - 99 mg/dL  TROPONIN I     Status: None   Collection Time    10/20/12  8:32 PM      Result Value Range   Troponin I <0.30  <0.30 ng/mL  HEMOGLOBIN A1C     Status: None   Collection Time    10/20/12  8:33 PM      Result Value Range   Hemoglobin A1C 5.4  <5.7 %   Mean Plasma Glucose 108  <117 mg/dL  TSH     Status: None   Collection Time    10/20/12  8:33 PM      Result Value Range   TSH 3.192  0.350 - 4.500 uIU/mL  COMPREHENSIVE METABOLIC PANEL     Status: Abnormal   Collection Time    10/21/12  1:53 AM      Result Value Range   Sodium 138  135 - 145 mEq/L   Potassium 3.7  3.5 - 5.1 mEq/L   Chloride 102  96 - 112 mEq/L   CO2 29  19 - 32 mEq/L   Glucose, Bld 104 (*) 70 - 99 mg/dL   BUN 12  6 - 23 mg/dL   Creatinine, Ser 1.47  0.50 - 1.10 mg/dL   Calcium 9.0  8.4 - 82.9 mg/dL   Total Protein 6.4  6.0 - 8.3 g/dL   Albumin 3.7  3.5 - 5.2 g/dL   AST 22  0 - 37 U/L   ALT 15  0 - 35 U/L   Alkaline Phosphatase 75  39 - 117 U/L   Total Bilirubin 0.4   0.3 - 1.2 mg/dL   GFR calc non Af Amer 86 (*) >90 mL/min   GFR calc Af Amer >90  >90 mL/min  CBC     Status: None   Collection Time    10/21/12  1:53 AM      Result Value Range   WBC 7.4  4.0 - 10.5 K/uL   RBC 4.56  3.87 - 5.11 MIL/uL   Hemoglobin 14.0  12.0 - 15.0 g/dL   HCT 56.2  13.0 - 86.5 %   MCV 86.2  78.0 - 100.0 fL   MCH 30.7  26.0 - 34.0 pg   MCHC 35.6  30.0 - 36.0 g/dL   RDW 78.4  69.6 - 29.5 %   Platelets 262  150 - 400 K/uL  TROPONIN I     Status: None   Collection Time    10/21/12  1:53 AM      Result Value Range   Troponin I <0.30  <0.30 ng/mL  TROPONIN I     Status: None   Collection Time    10/21/12  8:10 AM      Result Value Range   Troponin I <0.30  <0.30 ng/mL  PROTIME-INR     Status: None   Collection Time    10/21/12 10:30 AM      Result Value Range   Prothrombin Time 12.9  11.6 - 15.2 seconds   INR 0.98  0.00 - 1.49    Imaging results:  Dg Chest 2 View  10/20/2012  *RADIOLOGY REPORT*  Clinical Data: Chest pain.  CHEST - 2 VIEW  Comparison: None  Findings: The cardiomediastinal silhouette is unremarkable. Mild peribronchial thickening / COPD is noted. There is no evidence of focal airspace disease, pulmonary edema, suspicious pulmonary nodule/mass, pleural effusion, or pneumothorax. No acute bony abnormalities are identified.  IMPRESSION: No evidence of acute cardiopulmonary disease.  COPD.   Original Report Authenticated By: Harmon Pier, M.D.     Assessment and Plan: I agree with the formulated Assessment and Plan with the following changes:   1. Unstable angina - Cards eval pt and rec a stress test that showed 1mm horizontal ST segment depression in inferior lateral leads with increased BP and transient CP that resolved as the BP normalized. Pt has now been taken to the cath lab. She is currently on ASA and statin but not a BB yet. As for RF stratification - her A1C was 5.4, LDL was 143, and HDL was 33.   2. H/O hyperlipidemia that didn't require tx -  LDL goal will depend on cath results.  3. Tobacco use disorder - smoking cessation counseling.   4. Preventative care - pt will be offered PCP in [facility] prior to D/C.  Burns Spain, MD 4/30/20142:42 PM

## 2012-10-21 NOTE — CV Procedure (Signed)
Cardiac Catheterization Procedure Note  Name: Cassandra Castillo MRN: [id number] DOB: May 12, 1946  Procedure: Left Heart Cath, Selective Coronary Angiography, LV angiography, PTCA and stenting of the  distal and proximal right coronary artery with drug-eluting stents.  Indication: Unstable angina with rest pain and abnormal treadmill stress test.  Medications:  Sedation:  1 mg IV Versed, 25 mcg IV Fentanyl  Contrast:  200 mL  Omnipaque  Procedural Details: The right wrist was prepped, draped, and anesthetized with 1% lidocaine. Using the modified Seldinger technique, a 6   French  slender sheath was introduced into the right radial artery. 3 mg of verapamil was administered through the sheath, weight-based unfractionated heparin was administered intravenously. A Jackie  catheter was used for selective coronary angiography. A pigtail catheter was used for left ventriculography. Catheter exchanges were performed over an exchange length guidewire. There were no immediate procedural complications.  Procedural Findings:  Hemodynamics: AO:  162/86   mmHg LV:  164/12    mmHg LVEDP:  20  mmHg  Coronary angiography: Coronary dominance:  right   Left Main:   normal  Left Anterior Descending (LAD):   normal in size and and moderately calcified in the proximal and midsegment. There is diffuse 20% disease proximally. In the midsegment, there is diffuse 30% disease.  1st diagonal (D1):   small in size with minor irregularities.  2nd diagonal (D2):   normal in size with minor irregularities.  3rd diagonal (D3):   large in size with minor irregularities.  Circumflex (LCx):   normal in size and nondominant. The vessel is mildly calcified. There is 20% ostial stenosis. The rest of the vessel has minor irregularities.  1st obtuse marginal:   very small in size.  2nd obtuse marginal:   normal in size with no significant disease.   3rd obtuse marginal:   small in size with no significant  disease.   Right Coronary Artery:  large in size and dominant. The vessel is moderately calcified throughout its course. There is a 70-80 % hazy stenosis proximally. The mid vessel has minor irregularities. There is a 90% hazy stenosis distally at the bifurcation of the PDA/PL.  posterior descending artery:  normal in size with 70% ostial stenosis.  Posterior AV groove: Large in size with minor disease at the ostium. The rest of the vessel has no significant disease.   Left ventriculography: Left ventricular systolic function is  normal and  , LVEF is estimated at  and 60 %, there is  no  significant mitral regurgitation   PCI Note:  Following the diagnostic procedure, the decision was made to proceed with PCI. an ACT was already 210. She was given a total of 7500 units of unfractionated heparin for both the diagnostic and interventional procedure. ACT was 250. Plavix 600 mg was given. A 6 [place]  JR 4  guide catheter was inserted.  An Intuition coronary guidewire was used to cross the lesion and placed in the posterior AV groove artery. RunThrough wire was also used to wire the PDA .  The lesion was predilated with a  2.5 x 12  balloon into the PDA .  The lesion was then stented with a  2.5 x 18 mm Xience stent.   I then pulled the wire from the AV groove artery and dry to be wire through the stent struts but was not able to tortuosity. The stent was postdilated with a  2.75 x 15 mm  noncompliant balloon.   there was minimal  plaque shift into the ostium of the AV groove artery and thus I elected not to keep trying to be wire. Following PCI, there was 0% residual stenosis and TIMI-3 flow.  the lesion in the proximal RCA was direct stented with a 3.0 x 18 mm Xience drug-eluting stent which was postdilated with a 3.25 mm noncompliant balloon Final angiography confirmed an excellent result. The patient tolerated the procedure well. There were no immediate procedural complications. A TR band was used for  radial hemostasis. The patient was transferred to the post catheterization recovery area for further monitoring.  PCI Data: Vessel -  distal RCA/Segment  Percent Stenosis (pre)   90% TIMI-flow 3  Stent  2.5 x 18 mm Xience drug-eluting stent postdilated with a 2.75 noncompliant balloon  Percent Stenosis (post)  0% TIMI-flow (post)  3  Vessel -  RCA/Segment -  proximal Percent Stenosis (pre)  70-80% TIMI-flow 3  Stent  3.0 x 18 mm  Xience drug-eluting stent postdilated with a 3.25 noncompliant balloon  Percent Stenosis (post)  0%  TIMI-flow  3  Final Conclusions:  1. Severe one-vessel coronary artery disease in the distal and proximal RCA. The LAD is also significantly calcified with mild to moderate nonobstructive disease. 2 2. Normal LV systolic function. 3. Successful angioplasty and drug-eluting stent placement to the proximal and distal RCA. This was overall a difficult procedure due to bifurcation medication distally and tortuous right coronary artery.    Recommendations:  Recommend dual antiplatelet therapy for at least one year. Aggressive treatment of risk factors recommended as well as smoking cessation.  Lorine Bears MD, [facility] 10/21/2012, 4:12 PM

## 2012-10-22 DIAGNOSIS — E785 Hyperlipidemia, unspecified: Secondary | ICD-10-CM | POA: Diagnosis not present

## 2012-10-22 DIAGNOSIS — R079 Chest pain, unspecified: Secondary | ICD-10-CM | POA: Diagnosis not present

## 2012-10-22 DIAGNOSIS — I200 Unstable angina: Secondary | ICD-10-CM | POA: Diagnosis not present

## 2012-10-22 DIAGNOSIS — J449 Chronic obstructive pulmonary disease, unspecified: Secondary | ICD-10-CM | POA: Diagnosis not present

## 2012-10-22 DIAGNOSIS — I2510 Atherosclerotic heart disease of native coronary artery without angina pectoris: Secondary | ICD-10-CM | POA: Diagnosis not present

## 2012-10-22 LAB — CBC
HCT: 39.2 % (ref 36.0–46.0)
MCH: 30.6 pg (ref 26.0–34.0)
MCHC: 34.9 g/dL (ref 30.0–36.0)
MCV: 87.7 fL (ref 78.0–100.0)
RDW: 13.4 % (ref 11.5–15.5)
WBC: 7.2 10*3/uL (ref 4.0–10.5)

## 2012-10-22 LAB — BASIC METABOLIC PANEL
BUN: 10 mg/dL (ref 6–23)
CO2: 27 mEq/L (ref 19–32)
Calcium: 9.1 mg/dL (ref 8.4–10.5)
Chloride: 105 mEq/L (ref 96–112)
Creatinine, Ser: 0.8 mg/dL (ref 0.50–1.10)
GFR calc Af Amer: 88 mL/min — ABNORMAL LOW (ref >90)
GFR calc non Af Amer: 76 mL/min — ABNORMAL LOW (ref >90)
Glucose, Bld: 93 mg/dL (ref 70–99)
Potassium: 4.4 mEq/L (ref 3.5–5.1)
Sodium: 140 mEq/L (ref 135–145)

## 2012-10-22 MED ORDER — ASPIRIN 81 MG PO TBEC: 81.0000 mg | DELAYED_RELEASE_TABLET | Freq: Every day | ORAL | Status: DC

## 2012-10-22 MED ORDER — CLOPIDOGREL BISULFATE 75 MG PO TABS: 75.0000 mg | ORAL_TABLET | Freq: Every day | ORAL | Status: DC

## 2012-10-22 MED ORDER — NITROGLYCERIN 0.4 MG SL SUBL: 0.4000 mg | SUBLINGUAL_TABLET | SUBLINGUAL | Status: DC | PRN

## 2012-10-22 MED ORDER — ATORVASTATIN CALCIUM 80 MG PO TABS: 80.0000 mg | ORAL_TABLET | Freq: Every day | ORAL | Status: AC

## 2012-10-22 MED ORDER — METOPROLOL TARTRATE 25 MG PO TABS: 25.0000 mg | ORAL_TABLET | Freq: Two times a day (BID) | ORAL | Status: DC

## 2012-10-22 NOTE — Progress Notes (Signed)
CARDIAC REHAB PHASE I   PRE:  Rate/Rhythm: 56SB  BP:  Supine: 124/75  Sitting:   Standing:    SaO2:   MODE:  Ambulation: 1000 ft   POST:  Rate/Rhythm: 94SR  BP:  Supine:   Sitting: 141/80  Standing:    SaO2:  0755-0910 Pt walked 1000 ft on RA with steady gait. Denied CP. Tolerated well. Education completed. Discussed smoking cessation and provided handouts. Pt talked about quitting cold [place] as she has done before. Discussed CRP 2 and permission given to refer to [place]. Stressed importance of taking plavix and meds.   Cassandra Nutting, RN BSN  10/22/2012 9:07 AM

## 2012-10-22 NOTE — Progress Notes (Signed)
Subjective: Post procedure day #1, s/p cath which showed severe 1 vessel CAD in the distal and proximal RCA, and significantly calcified LAD, placed a drug eluding stent in the proximal and distal RCA and performed angioplasty.  Patint states that her chest is improved today. She denies SOB, N, V.  Objective: Vital signs in last 24 hours: Filed Vitals:   10/21/12 2200 10/21/12 2332 10/22/12 0542 10/22/12 0805  BP: 141/62 114/54 120/79 141/80  Pulse:  56 69 68  Temp:  98.1 F (36.7 C) 97.9 F (36.6 C) 97.9 F (36.6 C)  TempSrc:  Oral Axillary Oral  Resp:      Height:      Weight:   208 lb 5.4 oz (94.5 kg)   SpO2:  96% 97% 97%   Weight change: -4 lb (-1.814 kg)  Intake/Output Summary (Last 24 hours) at 10/22/12 0905 Last data filed at 10/22/12 0600  Gross per 24 hour  Intake   1455 ml  Output    900 ml  Net    555 ml    Physical Exam Blood pressure 141/80, pulse 68, temperature 97.9 F (36.6 C), temperature source Oral, resp. rate 18, height 5\' 4"  (1.626 m), weight 208 lb 5.4 oz (94.5 kg), SpO2 97.00%. General: Obese, No acute distress, alert and oriented x 3  HEENT: PERRL, EOMI, moist mucous membranes, poor dentition, several missing teeth, oropharynx otherwise clear, no lesions. Thyromegaly noted, no palpated nodules. Cardiovascular: Regular rate and rhythm, no murmurs, rubs or gallops. No carotid bruits heard  Respiratory: Clear to auscultation bilaterally, no wheezes, rales, or rhonchi, decreased breath sounds at the bases  Abdomen: Soft, nondistended, nontender  Extremities: Warm and well-perfused, trace bilateral lower extremity edema.  Skin: Warm, dry, no rashes  Neuro: Normal mood and affect   Lab Results: Basic Metabolic Panel:  Recent Labs Lab 10/21/12 0153 10/21/12 1802 10/22/12 0455  NA 138  --  140  K 3.7  --  4.4  CL 102  --  105  CO2 29  --  27  GLUCOSE 104*  --  93  BUN 12  --  10  CREATININE 0.77 0.68 0.80  CALCIUM 9.0  --  9.1   Liver  Function Tests:  Recent Labs Lab 10/21/12 0153  AST 22  ALT 15  ALKPHOS 75  BILITOT 0.4  PROT 6.4  ALBUMIN 3.7   CBC:  Recent Labs Lab 10/21/12 1802 10/22/12 0455  WBC 7.1 7.2  HGB 14.7 13.7  HCT 42.0 39.2  MCV 86.4 87.7  PLT 268 241   Cardiac Enzymes:  Recent Labs Lab 10/20/12 2032 10/21/12 0153 10/21/12 0810  TROPONINI <0.30 <0.30 <0.30   Hemoglobin A1C:  Recent Labs Lab 10/20/12 2033  HGBA1C 5.4   Fasting Lipid Panel:  Recent Labs Lab 10/20/12 2032  CHOL 216*  HDL 33*  LDLCALC 143*  TRIG 202*  CHOLHDL 6.5   Thyroid Function Tests:  Recent Labs Lab 10/20/12 2033  TSH 3.192   Studies/Results: Dg Chest 2 View  10/20/2012  *RADIOLOGY REPORT*  Clinical Data: Chest pain.  CHEST - 2 VIEW  Comparison: None  Findings: The cardiomediastinal silhouette is unremarkable. Mild peribronchial thickening / COPD is noted. There is no evidence of focal airspace disease, pulmonary edema, suspicious pulmonary nodule/mass, pleural effusion, or pneumothorax. No acute bony abnormalities are identified.  IMPRESSION: No evidence of acute cardiopulmonary disease.  COPD.   Original Report Authenticated By: Harmon Pier, M.D.    Medications:  Medications reviewed  Scheduled  Meds: . aspirin EC  81 mg Oral Daily  . atorvastatin  80 mg Oral q1800  . clopidogrel  75 mg Oral Q breakfast  . enoxaparin (LOVENOX) injection  40 mg Subcutaneous Q24H  . metoprolol tartrate  25 mg Oral BID  . sodium chloride  3 mL Intravenous Q12H  . sodium chloride  3 mL Intravenous Q12H   Continuous Infusions:   PRN Meds:.acetaminophen, morphine injection, nitroGLYCERIN, ondansetron (ZOFRAN) IV, ondansetron, sodium chloride  Assessment/Plan:  Chest pain with abnormal stress test: 2 weeks duration prior to admission. Troponins negative x3. LDL 143, HDL 33. Hgb A1c wnl. However, stress test performed 2/2 risk factors and family history. Patient failed stress test on 4/29 with increased HR and  ST depression. A heart catheterization was performed on 4/30, which showed severe 1 vessel CAD in the distal and proximal RCA, and significantly calcified LAD; a drug eluding stent was placed in the proximal and distal RCA and performed angioplasty. She will require dual antiplatelet therapy for the next year. -cont ASA, Plavix, statin, PRN ntg  -risk stratification, will need follow up as outpatient -smoking cessation counseling  Hyperlipidemia: LDL 143, HDL 33 -80mg  lipitor started, continue  Hypertension: BP elevated 150s/70 on admission, untreated. She was started on Lopressor 25mg  po BID, and her BP has been well controlled today 120/79.  Thyromegaly noted on exam: TSH is wnl -will need further work up incl T3, T4, ultrasound  DVT prophylaxis:  Lovenox   Diet:  N.p.o. Until after cath today  Disposition:  Anticipate discharge in 0-1 days  Patient will need to establish care with a PCP and get routine medical care including mammogram, colonoscopy, blood pressure f/u, smoking cessation Patient does not have a PCP but wants to f/u with Dr. Arvella Merles.    LOS: 2 days   Genelle Gather 10/22/2012, 9:05 AM

## 2012-10-22 NOTE — Progress Notes (Signed)
[name], female   DOB: April 26, 1946, 67 y.o.   MRN: [id number]    Subjective:  Denies SSCP, palpitations or Dyspnea   Objective:  Filed Vitals:   10/21/12 2159 10/21/12 2200 10/21/12 2332 10/22/12 0542  BP: 141/62 141/62 114/54 120/79  Pulse: 63  56 69  Temp:   98.1 F (36.7 C) 97.9 F (36.6 C)  TempSrc:   Oral Axillary  Resp:      Height:      Weight:    208 lb 5.4 oz (94.5 kg)  SpO2:   96% 97%    Intake/Output from previous day:  Intake/Output Summary (Last 24 hours) at 10/22/12 4098 Last data filed at 10/22/12 0600  Gross per 24 hour  Intake   1455 ml  Output    900 ml  Net    555 ml    Physical Exam: Affect appropriate Obese white female HEENT: normal Neck supple with no adenopathy JVP normal no bruits no thyromegaly Lungs clear with no wheezing and good diaphragmatic motion Heart:  S1/S2 no murmur, no rub, gallop or click PMI normal Abdomen: benighn, BS positve, no tenderness, no AAA no bruit.  No HSM or HJR Distal pulses intact with no bruits No edema Neuro non-focal Skin warm and dry No muscular weakness Right radial cath site A   Lab Results: Basic Metabolic Panel:  Recent Labs  11/91/47 0153 10/21/12 1802 10/22/12 0455  NA 138  --  140  K 3.7  --  4.4  CL 102  --  105  CO2 29  --  27  GLUCOSE 104*  --  93  BUN 12  --  10  CREATININE 0.77 0.68 0.80  CALCIUM 9.0  --  9.1   Liver Function Tests:  Recent Labs  10/21/12 0153  AST 22  ALT 15  ALKPHOS 75  BILITOT 0.4  PROT 6.4  ALBUMIN 3.7   CBC:  Recent Labs  10/21/12 1802 10/22/12 0455  WBC 7.1 7.2  HGB 14.7 13.7  HCT 42.0 39.2  MCV 86.4 87.7  PLT 268 241   Cardiac Enzymes:  Recent Labs  10/20/12 2032 10/21/12 0153 10/21/12 0810  TROPONINI <0.30 <0.30 <0.30   Hemoglobin A1C:  Recent Labs  10/20/12 2033  HGBA1C 5.4   Fasting Lipid Panel:  Recent Labs  10/20/12 2032  CHOL 216*  HDL 33*  LDLCALC 143*  TRIG 202*  CHOLHDL 6.5   Thyroid  Function Tests:  Recent Labs  10/20/12 2033  TSH 3.192   Anemia Panel: No results found for this basename: VITAMINB12, FOLATE, FERRITIN, TIBC, IRON, RETICCTPCT,  in the last 72 hours  Imaging: Dg Chest 2 View  10/20/2012  *RADIOLOGY REPORT*  Clinical Data: Chest pain.  CHEST - 2 VIEW  Comparison: None  Findings: The cardiomediastinal silhouette is unremarkable. Mild peribronchial thickening / COPD is noted. There is no evidence of focal airspace disease, pulmonary edema, suspicious pulmonary nodule/mass, pleural effusion, or pneumothorax. No acute bony abnormalities are identified.  IMPRESSION: No evidence of acute cardiopulmonary disease.  COPD.   Original Report Authenticated By: Harmon Pier, M.D.     Cardiac Studies:  ECG:  SR no acute ischemic changes    Telemetry: NSR no arrhythmia 10/22/2012   Echo:   Medications:   . aspirin EC  81 mg Oral Daily  . atorvastatin  80 mg Oral q1800  . clopidogrel  75 mg Oral Q breakfast  . enoxaparin (LOVENOX) injection  40 mg Subcutaneous Q24H  .  metoprolol tartrate  25 mg Oral BID  . sodium chloride  3 mL Intravenous Q12H  . sodium chloride  3 mL Intravenous Q12H       Assessment/Plan:  CAD:  S/P stent to RCA with moderate LAD disease. DAT for a year  Chol:  Continue statin  Will arrange f/u in our office 4 weeks Make sure she has SL nitro at discharge  Charlton Haws 10/22/2012, 8:23 AM

## 2012-10-22 NOTE — Discharge Summary (Addendum)
Internal Medicine Teaching [facility] Discharge Note  Name: Cassandra Castillo MRN: [id number] DOB: 07-Jul-1945 67 y.o.  Date of Admission: 10/20/2012  4:02 PM Date of Discharge: 10/22/2012 Attending Physician: Burns Spain, MD  Discharge Diagnosis: Active Problems:   Chest pain   CAD, severe 1 vessel disease in RCA   S/P Cardiac Cath   Discharge Medications:   Medication List    ASK your doctor about these medications       naproxen sodium 220 MG tablet  Commonly known as:  ANAPROX  Take 220 mg by mouth daily as needed. For pain      ALSO: Plavix, Lipitor, Aspirin, Lopressor   Disposition and follow-up:   Cassandra Castillo was discharged from [facility] in Stable condition.  At the hospital follow up visit please address  - Her medication compliance with the Plavix and ASA - Compliance with Lipitor and Lopressor - Blood pressure control - Her thyromegaly; TSH normal, but she will need T3/T4 and possible ultrasound   Follow-up Appointments:     Follow-up Information   Follow up with Charlton Haws, MD. Schedule an appointment as soon as possible for a visit in 4 weeks.   Contact information:   [address] Cassandra Castillo [place] [address] [phone]       Follow up with Johny Blamer, MD. Schedule an appointment as soon as possible for a visit in 2 months.   Contact information:   [facility] [address] [place] [address] [phone]      Discharge Orders   Future Orders Complete By Expires     Amb Referral to Cardiac Rehabilitation  As directed        Consultations: Treatment Team: Cardiology, Corinda Gubler - she will follow up with them.   Procedures Performed:  Dg Chest 2 View  10/20/2012  *RADIOLOGY REPORT*  Clinical Data: Chest pain.  CHEST - 2 VIEW  Comparison: None  Findings: The cardiomediastinal silhouette is unremarkable. Mild peribronchial thickening / COPD is  noted. There is no evidence of focal airspace disease, pulmonary edema, suspicious pulmonary nodule/mass, pleural effusion, or pneumothorax. No acute bony abnormalities are identified.  IMPRESSION: No evidence of acute cardiopulmonary disease.  COPD.   Original Report Authenticated By: Harmon Pier, M.D.    Cardiac Cath:  Final Conclusions:  1. Severe one-vessel coronary artery disease in the distal and proximal RCA. The LAD is also significantly calcified with mild to moderate nonobstructive disease. 2  2. Normal LV systolic function.  3. Successful angioplasty and drug-eluting stent placement to the proximal and distal RCA. This was overall a difficult procedure due to bifurcation medication distally and tortuous right coronary artery.    Admission HPI:  History of Present Illness:  The patient is a 67 year old lady with a history of hyperlipidemia, benign breast mass removed in 1992, and no interaction with the medical community in over 10 years, who presents with 2 weeks of chest pain/discomfort. She has not noticed any associated nausea, vomiting, shortness of breath, diaphoresis. There is no association with activity levels as the pain can occur at rest or at any random time. She was able to mow the grass a few days ago without any chest pressure at all. Patient describes the pain as "dull," "tightness" and "pressure" that is located across the front of her chest and radiates to her back and shoulder blades. Sometimes radiates to the left side. Patient feels like this  pain is worsening and has become more frequent over the last 2 weeks. Yesterday she states that the pain was almost constant. Patient has tried Aleve for this problem without any relief. Patient does endorse several episodes of palpitations.  Patient has also noticed worsening indigestion over the past couple of weeks with burning epigastric pain and chest pain. She has tried Scientist, research (medical) for this problem and she states that the pain improves  after using them. She feels this pain is different than the chest pain and she has been having.  Patient does not have a PCP and has not had regular medical care in over 10 years. She states that she doesn't like doctors and doesn't like taking medicines.  Family history significant for a father who had severe coronary artery disease, had a four-vessel CABG at age 1 and a second 2 vessel CABG at age 70. He died of congestive heart failure 10 years After the CABG. Patient also has an uncle who had severe coronary artery disease also.  Patient smokes one pack per day and has done so for about 40 years. She denies any alcohol use.  Patient denies any dysuria, urinary frequency, diarrhea, recent illness or URI.   Review of Systems:  Pertinent items are noted in HPI.   Physical Exam  Blood pressure 150/92, pulse 62, temperature 98 F (36.7 C), temperature source Oral, resp. rate 18, SpO2 98.00%.  General: Obese, No acute distress, alert and oriented x 3  HEENT: PERRL, EOMI, moist mucous membranes, poor dentition, several missing teeth, oropharynx otherwise clear, no lesions  Cardiovascular: Regular rate and rhythm, no murmurs, rubs or gallops. No carotid bruits heard  Chest wall: Anterior chest wall is tender to palpation, feels different than her usual chest pain.  Respiratory: Clear to auscultation bilaterally, no wheezes, rales, or rhonchi, decreased breath sounds at the bases  Abdomen: Soft, nondistended, minimal tenderness in the right upper quadrant and epigastrium, bowel sounds present, no masses, no hepatosplenomegaly appreciated  Extremities: Warm and well-perfused, trace bilateral lower extremity edema.  Skin: Warm, dry, no rashes  Neuro: Not anxious appearing, no depressed mood, normal affect   Hospital Course by problem list:  Severe 1 vessel CAD 2 weeks duration prior to admission. Troponins negative x3. LDL 143, HDL 33. Hgb A1c wnl. However, stress test performed 2/2 risk factors  and family history. Patient failed stress test on 4/29 with increased HR and ST depression. A heart catheterization was performed on 4/30, which showed severe 1 vessel CAD in the distal and proximal RCA, and significantly calcified LAD; a drug eluding stent was placed in the proximal and distal RCA and performed angioplasty.  She will require dual antiplatelet therapy for the next year. Smoking cessation counseling provided prior to discharge.  -cont ASA, Plavix, statin, lopressor, and PRN ntg  -will need follow up as outpatient   Hyperlipidemia:  LDL 143, HDL 33  -80mg  lipitor started, continue   Hypertension:  BP elevated 150s/70 on admission, untreated. She was started on Lopressor 25mg  po BID, and her BP has been well controlled today 120/79.   Thyromegaly noted on exam:  TSH is wnl  -will need further work up incl T3, T4, ultrasound     Discharge Vitals:  BP 141/80  Pulse 68  Temp(Src) 97.9 F (36.6 C) (Oral)  Resp 18  Ht 5\' 4"  (1.626 m)  Wt 208 lb 5.4 oz (94.5 kg)  BMI 35.74 kg/m2  SpO2 97%  Discharge Labs:  Results for orders placed during  the hospital encounter of 10/20/12 (from the past 24 hour(s))  POCT ACTIVATED CLOTTING TIME     Status: None   Collection Time    10/21/12  3:13 PM      Result Value Range   Activated Clotting Time 203    POCT ACTIVATED CLOTTING TIME     Status: None   Collection Time    10/21/12  3:29 PM      Result Value Range   Activated Clotting Time 258    CBC     Status: None   Collection Time    10/21/12  6:02 PM      Result Value Range   WBC 7.1  4.0 - 10.5 K/uL   RBC 4.86  3.87 - 5.11 MIL/uL   Hemoglobin 14.7  12.0 - 15.0 g/dL   HCT 09.8  11.9 - 14.7 %   MCV 86.4  78.0 - 100.0 fL   MCH 30.2  26.0 - 34.0 pg   MCHC 35.0  30.0 - 36.0 g/dL   RDW 82.9  56.2 - 13.0 %   Platelets 268  150 - 400 K/uL  CREATININE, SERUM     Status: Abnormal   Collection Time    10/21/12  6:02 PM      Result Value Range   Creatinine, Ser 0.68  0.50 -  1.10 mg/dL   GFR calc non Af Amer 90 (*) >90 mL/min   GFR calc Af Amer >90  >90 mL/min  CBC     Status: None   Collection Time    10/22/12  4:55 AM      Result Value Range   WBC 7.2  4.0 - 10.5 K/uL   RBC 4.47  3.87 - 5.11 MIL/uL   Hemoglobin 13.7  12.0 - 15.0 g/dL   HCT 86.5  78.4 - 69.6 %   MCV 87.7  78.0 - 100.0 fL   MCH 30.6  26.0 - 34.0 pg   MCHC 34.9  30.0 - 36.0 g/dL   RDW 29.5  28.4 - 13.2 %   Platelets 241  150 - 400 K/uL  BASIC METABOLIC PANEL     Status: Abnormal   Collection Time    10/22/12  4:55 AM      Result Value Range   Sodium 140  135 - 145 mEq/L   Potassium 4.4  3.5 - 5.1 mEq/L   Chloride 105  96 - 112 mEq/L   CO2 27  19 - 32 mEq/L   Glucose, Bld 93  70 - 99 mg/dL   BUN 10  6 - 23 mg/dL   Creatinine, Ser 4.40  0.50 - 1.10 mg/dL   Calcium 9.1  8.4 - 10.2 mg/dL   GFR calc non Af Amer 76 (*) >90 mL/min   GFR calc Af Amer 88 (*) >90 mL/min    Signed: Genelle Gather 10/22/2012, 11:31 AM   Time Spent on Discharge: Services Ordered on Discharge: None Equipment Ordered on Discharge: None  I saw Ms. Shawnie Pons on day of discharge and agree with documented findings as noted above by Dr. Sherrine Maples.    Signed Criselda Peaches, EMILY

## 2012-10-23 NOTE — Progress Notes (Signed)
APPT  MADE FOR 11-19-12 AT 10:00 WITH DR NISHAN./CY

## 2012-10-27 NOTE — ED Provider Notes (Signed)
Medical screening examination/treatment/procedure(s) were performed by resident physician or non-physician practitioner and as supervising physician I was immediately available for consultation/collaboration.   KINDL,JAMES DOUGLAS MD.   James D Kindl, MD 10/27/12 1222 

## 2012-11-18 ENCOUNTER — Encounter: Payer: Self-pay | Admitting: *Deleted

## 2012-11-18 ENCOUNTER — Encounter: Payer: Self-pay | Admitting: Cardiovascular Disease

## 2012-11-18 DIAGNOSIS — E785 Hyperlipidemia, unspecified: Secondary | ICD-10-CM | POA: Insufficient documentation

## 2012-11-18 DIAGNOSIS — E079 Disorder of thyroid, unspecified: Secondary | ICD-10-CM | POA: Insufficient documentation

## 2012-11-18 DIAGNOSIS — I2510 Atherosclerotic heart disease of native coronary artery without angina pectoris: Secondary | ICD-10-CM | POA: Insufficient documentation

## 2012-11-19 ENCOUNTER — Encounter: Payer: Self-pay | Admitting: Cardiovascular Disease

## 2012-11-19 ENCOUNTER — Ambulatory Visit (INDEPENDENT_AMBULATORY_CARE_PROVIDER_SITE_OTHER): Payer: Medicare Other | Admitting: Cardiovascular Disease

## 2012-11-19 VITALS — BP 140/80 | HR 60 | Ht 64.0 in | Wt 213.0 lb

## 2012-11-19 DIAGNOSIS — E785 Hyperlipidemia, unspecified: Secondary | ICD-10-CM

## 2012-11-19 DIAGNOSIS — I2510 Atherosclerotic heart disease of native coronary artery without angina pectoris: Secondary | ICD-10-CM

## 2012-11-19 NOTE — Assessment & Plan Note (Signed)
Stable with no angina and good activity level.  Continue medical Rx  

## 2012-11-19 NOTE — Progress Notes (Signed)
[name], female   DOB: 08-Dec-1945, 67 y.o.   MRN: [id number] Seen post discharge with SSCP and CAD  Cath by Dr Kirke Corin  Final Conclusions:  1. Severe one-vessel coronary artery disease in the distal and proximal RCA. The LAD is also significantly calcified with mild to moderate nonobstructive disease. 2  2. Normal LV systolic function.  3. Successful angioplasty and drug-eluting stent placement to the proximal and distal RCA. This was overall a difficult procedure due to bifurcation medication distally and tortuous right coronary artery.  Doing well with no chest pain Works with Cassandra Castillo at [employer]  Has quit smoking for a month now I congratulated her on this  ROS: Denies fever, malais, weight loss, blurry vision, decreased visual acuity, cough, sputum, SOB, hemoptysis, pleuritic pain, palpitaitons, heartburn, abdominal pain, melena, lower extremity edema, claudication, or rash.  All other systems reviewed and negative  General: Affect appropriate Healthy:  appears stated age HEENT: normal Neck supple with no adenopathy JVP normal no bruits no thyromegaly Lungs clear with no wheezing and good diaphragmatic motion Heart:  S1/S2 no murmur, no rub, gallop or click PMI normal Abdomen: benighn, BS positve, no tenderness, no AAA no bruit.  No HSM or HJR Distal pulses intact with no bruits No edema Neuro non-focal Skin warm and dry No muscular weakness Right radial palpable   Current Outpatient Prescriptions  Medication Sig Dispense Refill  . aspirin EC 81 MG EC tablet Take 1 tablet (81 mg total) by mouth daily.      Marland Kitchen atorvastatin (LIPITOR) 80 MG tablet Take 1 tablet (80 mg total) by mouth daily at 6 PM.  30 tablet  1  . clopidogrel (PLAVIX) 75 MG tablet Take 1 tablet (75 mg total) by mouth daily with breakfast.  30 tablet  1  . metoprolol tartrate (LOPRESSOR) 25 MG tablet Take 1 tablet (25 mg total) by mouth 2 (two) times daily.  60 tablet  1  . naproxen sodium  (ANAPROX) 220 MG tablet Take 220 mg by mouth daily as needed. For pain      . nitroGLYCERIN (NITROSTAT) 0.4 MG SL tablet Place 1 tablet (0.4 mg total) under the tongue every 5 (five) minutes as needed for chest pain (up to 3 times).  30 tablet  0   No current facility-administered medications for this visit.    Allergies  Codeine  Electrocardiogram:  SR rate 56 normal  Assessment and Plan

## 2012-11-19 NOTE — Patient Instructions (Signed)
Dr. Eden Emms 6 months.

## 2012-11-19 NOTE — Assessment & Plan Note (Signed)
Cholesterol is at goal.  Continue current dose of statin and diet Rx.  No myalgias or side effects.  F/U  LFT's in 6 months. Lab Results  Component Value Date   LDLCALC 143* 10/20/2012

## 2012-11-23 DIAGNOSIS — E785 Hyperlipidemia, unspecified: Secondary | ICD-10-CM | POA: Diagnosis not present

## 2012-11-23 DIAGNOSIS — Z1331 Encounter for screening for depression: Secondary | ICD-10-CM | POA: Diagnosis not present

## 2012-11-23 DIAGNOSIS — I2510 Atherosclerotic heart disease of native coronary artery without angina pectoris: Secondary | ICD-10-CM | POA: Diagnosis not present

## 2012-11-23 DIAGNOSIS — I10 Essential (primary) hypertension: Secondary | ICD-10-CM | POA: Diagnosis not present

## 2012-11-23 DIAGNOSIS — E559 Vitamin D deficiency, unspecified: Secondary | ICD-10-CM | POA: Diagnosis not present

## 2013-03-08 DIAGNOSIS — H2510 Age-related nuclear cataract, unspecified eye: Secondary | ICD-10-CM | POA: Diagnosis not present

## 2013-03-08 DIAGNOSIS — H524 Presbyopia: Secondary | ICD-10-CM | POA: Diagnosis not present

## 2013-03-08 DIAGNOSIS — H4320 Crystalline deposits in vitreous body, unspecified eye: Secondary | ICD-10-CM | POA: Diagnosis not present

## 2013-03-08 DIAGNOSIS — H1045 Other chronic allergic conjunctivitis: Secondary | ICD-10-CM | POA: Diagnosis not present

## 2013-04-20 DIAGNOSIS — E559 Vitamin D deficiency, unspecified: Secondary | ICD-10-CM | POA: Diagnosis not present

## 2013-06-02 ENCOUNTER — Encounter: Payer: Self-pay | Admitting: Cardiovascular Disease

## 2013-06-02 ENCOUNTER — Ambulatory Visit (INDEPENDENT_AMBULATORY_CARE_PROVIDER_SITE_OTHER): Payer: Medicare Other | Admitting: Cardiovascular Disease

## 2013-06-02 VITALS — BP 170/80 | HR 100 | Ht 64.0 in | Wt 216.0 lb

## 2013-06-02 DIAGNOSIS — Z79899 Other long term (current) drug therapy: Secondary | ICD-10-CM

## 2013-06-02 DIAGNOSIS — I2510 Atherosclerotic heart disease of native coronary artery without angina pectoris: Secondary | ICD-10-CM | POA: Diagnosis not present

## 2013-06-02 DIAGNOSIS — I10 Essential (primary) hypertension: Secondary | ICD-10-CM | POA: Diagnosis not present

## 2013-06-02 DIAGNOSIS — R079 Chest pain, unspecified: Secondary | ICD-10-CM | POA: Diagnosis not present

## 2013-06-02 MED ORDER — METOPROLOL TARTRATE 50 MG PO TABS: 50.0000 mg | ORAL_TABLET | Freq: Two times a day (BID) | ORAL | Status: DC

## 2013-06-02 NOTE — Assessment & Plan Note (Signed)
Improved but some residual atypical pain  F/U stress myovue as she had residual LAD disease.  Increase beta blocker She has SL nitoro DAT until 4/15

## 2013-06-02 NOTE — Assessment & Plan Note (Signed)
Cholesterol is at goal.  Continue current dose of statin and diet Rx.  No myalgias or side effects.  F/U  LFT's in 6 months. Lab Results  Component Value Date   LDLCALC 143* 10/20/2012  Check lipid and liver would like to lower statin dose

## 2013-06-02 NOTE — Assessment & Plan Note (Signed)
Increase lopressor to 50 bid  Low sodium diet

## 2013-06-02 NOTE — Patient Instructions (Addendum)
Your physician wants you to follow-up in:   April WITH  DR Haywood Filler will receive a reminder letter in the mail two months in advance. If you don't receive a letter, please call our office to schedule the follow-up appointment. Your physician has recommended you make the following change in your medication: INCREASE METOPROLOL TO  50 MG   TWICE DAILY  Your physician has requested that you have en exercise stress myoview. For further information please visit [email or web address] Please follow instruction sheet, as given.   Your physician recommends that you return for lab work in:  FASTING LIPID  LIVER

## 2013-06-02 NOTE — Progress Notes (Signed)
[name], female   DOB: [birth year], 67 y.o.   MRN: [id number] Seen post discharge  10/20/12 with SSCP and CAD Cath by Dr Kirke Corin  Final Conclusions:  1. Severe one-vessel coronary artery disease in the distal and proximal RCA. The LAD is also significantly calcified with mild to moderate nonobstructive disease. 2  2. Normal LV systolic function.  3. Successful angioplasty and drug-eluting stent placement to the proximal and distal RCA. This was overall a difficult procedure due to bifurcation medication distally and tortuous right coronary artery.  Doing well with no chest pain Works with [name] at [employer] Has quit smoking for a month now I congratulated her on this  Having some chest discomfort Not pressure not always exertional Improved since stent.  Was able to do 5K walk Wants to go to gym but a bit scared No rest pain And has not taken nitro   ROS: Denies fever, malais, weight loss, blurry vision, decreased visual acuity, cough, sputum, SOB, hemoptysis, pleuritic pain, palpitaitons, heartburn, abdominal pain, melena, lower extremity edema, claudication, or rash.  All other systems reviewed and negative  General: Affect appropriate Healthy:  appears stated age HEENT: normal Neck supple with no adenopathy JVP normal no bruits no thyromegaly Lungs clear with no wheezing and good diaphragmatic motion Heart:  S1/S2 soft SEM  murmur, no rub, gallop or click PMI normal Abdomen: benighn, BS positve, no tenderness, no AAA no bruit.  No HSM or HJR Distal pulses intact with no bruits No edema Neuro non-focal Skin warm and dry No muscular weakness   Current Outpatient Prescriptions  Medication Sig Dispense Refill  . aspirin EC 81 MG EC tablet Take 1 tablet (81 mg total) by mouth daily.      Marland Kitchen atorvastatin (LIPITOR) 80 MG tablet Take 1 tablet (80 mg total) by mouth daily at 6 PM.  30 tablet  1  . clopidogrel (PLAVIX) 75 MG tablet Take 1 tablet (75 mg total) by mouth daily  with breakfast.  30 tablet  1  . Cyanocobalamin (VITAMIN B-12 PO) Take by mouth. 5 ,000 units daily      . metoprolol tartrate (LOPRESSOR) 25 MG tablet Take 1 tablet (25 mg total) by mouth 2 (two) times daily.  60 tablet  1  . naproxen sodium (ANAPROX) 220 MG tablet Take 220 mg by mouth daily as needed. For pain      . nitroGLYCERIN (NITROSTAT) 0.4 MG SL tablet Place 1 tablet (0.4 mg total) under the tongue every 5 (five) minutes as needed for chest pain (up to 3 times).  30 tablet  0   No current facility-administered medications for this visit.    Allergies  Codeine  Electrocardiogram:  10/22/12  SR rate 54 poor R wave progression normal ST/T segments  Assessment and Plan

## 2013-06-22 ENCOUNTER — Encounter: Payer: Self-pay | Admitting: Cardiovascular Disease

## 2013-06-22 DIAGNOSIS — E785 Hyperlipidemia, unspecified: Secondary | ICD-10-CM | POA: Diagnosis not present

## 2013-06-28 ENCOUNTER — Other Ambulatory Visit (INDEPENDENT_AMBULATORY_CARE_PROVIDER_SITE_OTHER): Payer: Medicare Other

## 2013-06-28 ENCOUNTER — Encounter: Payer: Self-pay | Admitting: Cardiovascular Disease

## 2013-06-28 ENCOUNTER — Ambulatory Visit (HOSPITAL_COMMUNITY): Payer: Medicare Other | Attending: Cardiovascular Disease | Admitting: Radiology

## 2013-06-28 VITALS — BP 199/93 | HR 62 | Ht 64.0 in | Wt 213.0 lb

## 2013-06-28 DIAGNOSIS — I2510 Atherosclerotic heart disease of native coronary artery without angina pectoris: Secondary | ICD-10-CM | POA: Diagnosis not present

## 2013-06-28 DIAGNOSIS — Z79899 Other long term (current) drug therapy: Secondary | ICD-10-CM | POA: Diagnosis not present

## 2013-06-28 DIAGNOSIS — R079 Chest pain, unspecified: Secondary | ICD-10-CM | POA: Diagnosis not present

## 2013-06-28 LAB — LIPID PANEL
Cholesterol: 120 mg/dL (ref 0–200)
HDL: 35.3 mg/dL — ABNORMAL LOW (ref >39.00)
LDL Cholesterol: 58 mg/dL (ref 0–99)
TRIGLYCERIDES: 135 mg/dL (ref 0.0–149.0)
Total CHOL/HDL Ratio: 3
VLDL: 27 mg/dL (ref 0.0–40.0)

## 2013-06-28 LAB — HEPATIC FUNCTION PANEL
ALBUMIN: 4.2 g/dL (ref 3.5–5.2)
ALK PHOS: 83 U/L (ref 39–117)
ALT: 14 U/L (ref 0–35)
AST: 18 U/L (ref 0–37)
Bilirubin, Direct: 0.1 mg/dL (ref 0.0–0.3)
Total Bilirubin: 0.6 mg/dL (ref 0.3–1.2)
Total Protein: 7.1 g/dL (ref 6.0–8.3)

## 2013-06-28 MED ORDER — REGADENOSON 0.4 MG/5ML IV SOLN
0.4000 mg | Freq: Once | INTRAVENOUS | Status: AC
  Administered 2013-06-28: 0.4 mg via INTRAVENOUS

## 2013-06-28 MED ORDER — TECHNETIUM TC 99M SESTAMIBI GENERIC - CARDIOLITE
10.8000 | Freq: Once | INTRAVENOUS | Status: AC | PRN
  Administered 2013-06-28: 11 via INTRAVENOUS

## 2013-06-28 MED ORDER — TECHNETIUM TC 99M SESTAMIBI GENERIC - CARDIOLITE
33.0000 | Freq: Once | INTRAVENOUS | Status: AC | PRN
  Administered 2013-06-28: 33 via INTRAVENOUS

## 2013-06-28 NOTE — Progress Notes (Signed)
[facility] [address] [place], [place] [address] [phone]    Cardiology Nuclear Med Study  Cassandra Castillo is a 68 y.o. female     MRN : [id number]     DOB: [birth year]  Procedure Date: 06/28/2013  Nuclear Med Background Indication for Stress Test:  Evaluation for Ischemia and Stent Patency History:  CAD, Cath 09/2012, Stent RCA 2014, PTCA, COPD Cardiac Risk Factors: Family History - CAD, History of Smoking, Hypertension and Lipids  Symptoms:  Chest Pain (last date of chest discomfort was two days ago) and SOB   Nuclear Pre-Procedure Caffeine/Decaff Intake:  None > 12 hrs NPO After: 9:30pm   Lungs:  clear O2 Sat: 98% on room air. IV 0.9% NS with Angio Cath:  22g  IV Site: R Hand x 1, tolerated well IV Started by:  Irven Baltimore, RN  Chest Size (in):  40 Cup Size: DDD  Height: 5\' 4"  (1.626 m)  Weight:  213 lb (96.616 kg)  BMI:  Body mass index is 36.54 kg/(m^2). Tech Comments:  Held Metoprolol x 24 hrs    Nuclear Med Study 1 or 2 day study: 1 day  Stress Test Type:  Lexiscan  Reading MD: N/A  Order Authorizing Provider:  Jenkins Rouge, MD  Resting Radionuclide: Technetium 63m Sestamibi  Resting Radionuclide Dose: 11.0 mCi   Stress Radionuclide:  Technetium 11m Sestamibi  Stress Radionuclide Dose: 33.0 mCi           Stress Protocol Rest HR: 62 Stress HR: 90  Rest BP: 199/93 Stress BP: 143/77  Exercise Time (min): n/a METS: n/a           Dose of Adenosine (mg):  n/a Dose of Lexiscan: 0.4 mg  Dose of Atropine (mg): n/a Dose of Dobutamine: n/a mcg/kg/min (at max HR)  Stress Test Technologist: Glade Lloyd, BS-ES  Nuclear Technologist:  Charlton Amor, CNMT     Rest Procedure:  Myocardial perfusion imaging was performed at rest 45 minutes following the intravenous administration of Technetium 89m Sestamibi. Rest ECG: NSR - Normal EKG  Stress Procedure:  The patient received IV Lexiscan 0.4 mg over 15-seconds.  Technetium 51m Sestamibi  injected at 30-seconds.  Quantitative spect images were obtained after a 45 minute delay. Stress ECG: No significant change from baseline ECG  QPS Raw Data Images:  Normal; no motion artifact; normal heart/lung ratio. Stress Images:  Normal homogeneous uptake in all areas of the myocardium. Rest Images:  Normal homogeneous uptake in all areas of the myocardium. Subtraction (SDS):  Normal Transient Ischemic Dilatation (Normal <1.22):  1.01 Lung/Heart Ratio (Normal <0.45):  0.32  Quantitative Gated Spect Images QGS EDV:  83 ml QGS ESV:  24 ml  Impression Exercise Capacity:  Lexiscan with no exercise. BP Response:  Normal blood pressure response. Clinical Symptoms:  No significant symptoms noted. ECG Impression:  No significant ST segment change suggestive of ischemia. Comparison with Prior Nuclear Study: No images to compare  Overall Impression:  Normal stress nuclear study.  LV Ejection Fraction: 71%.  LV Wall Motion:  NL LV Function; NL Wall Motion   Jenkins Rouge

## 2013-10-28 ENCOUNTER — Ambulatory Visit (INDEPENDENT_AMBULATORY_CARE_PROVIDER_SITE_OTHER): Payer: Medicare Other | Admitting: Cardiovascular Disease

## 2013-10-28 ENCOUNTER — Encounter: Payer: Self-pay | Admitting: Cardiovascular Disease

## 2013-10-28 VITALS — BP 138/82 | HR 60 | Ht 64.0 in | Wt 224.0 lb

## 2013-10-28 DIAGNOSIS — I2510 Atherosclerotic heart disease of native coronary artery without angina pectoris: Secondary | ICD-10-CM

## 2013-10-28 DIAGNOSIS — I10 Essential (primary) hypertension: Secondary | ICD-10-CM | POA: Diagnosis not present

## 2013-10-28 NOTE — Progress Notes (Signed)
[name], female   DOB: February 24, 1946, 68 y.o.   MRN: [id number] Seen post discharge 10/20/12 with SSCP and CAD Cath by Dr Fletcher Anon  Final Conclusions:  1. Severe one-vessel coronary artery disease in the distal and proximal RCA. The LAD is also significantly calcified with mild to moderate nonobstructive disease. 2  2. Normal LV systolic function.  3. Successful angioplasty and drug-eluting stent placement to the proximal and distal RCA. This was overall a difficult procedure due to bifurcation medication distally and tortuous right coronary artery.   Works with [name] at [employer] Has quit smoking for a month now I congratulated her on this   In January had some chest discomfort Not pressure not always exertional Improved since stent. Was able to do 5K walk Wants to go to gym but a bit scared No rest pain  And has not taken nitro  F/U myovue non ischemic  1/15 normal EF 71 % no ischemia   LDL 58 with normal LFTls in January    ROS: Denies fever, malais, weight loss, blurry vision, decreased visual acuity, cough, sputum, SOB, hemoptysis, pleuritic pain, palpitaitons, heartburn, abdominal pain, melena, lower extremity edema, claudication, or rash.  All other systems reviewed and negative  General: Affect appropriate Overweight white female  HEENT: poor dentition  Neck supple with no adenopathy JVP normal no bruits no thyromegaly Lungs clear with no wheezing and good diaphragmatic motion Heart:  S1/S2 no murmur, no rub, gallop or click PMI normal Abdomen: benighn, BS positve, no tenderness, no AAA no bruit.  No HSM or HJR Distal pulses intact with no bruits No edema Neuro non-focal Skin warm and dry No muscular weakness   Current Outpatient Prescriptions  Medication Sig Dispense Refill  . aspirin EC 81 MG EC tablet Take 1 tablet (81 mg total) by mouth daily.      Marland Kitchen atorvastatin (LIPITOR) 80 MG tablet Take 1 tablet (80 mg total) by mouth daily at 6 PM.  30 tablet  1   . cholecalciferol (VITAMIN D) 1000 UNITS tablet Take 4,000 Units by mouth daily.      . clopidogrel (PLAVIX) 75 MG tablet Take 1 tablet (75 mg total) by mouth daily with breakfast.  30 tablet  1  . metoprolol tartrate (LOPRESSOR) 50 MG tablet Take 1 tablet (50 mg total) by mouth 2 (two) times daily.  60 tablet  11  . naproxen sodium (ANAPROX) 220 MG tablet Take 220 mg by mouth daily as needed. For pain      . nitroGLYCERIN (NITROSTAT) 0.4 MG SL tablet Place 1 tablet (0.4 mg total) under the tongue every 5 (five) minutes as needed for chest pain (up to 3 times).  30 tablet  0   No current facility-administered medications for this visit.    Allergies  Codeine and Other  Electrocardiogram:  SR rate 60  Normal   Assessment and Plan

## 2013-10-28 NOTE — Assessment & Plan Note (Signed)
Cholesterol is at goal.  Continue current dose of statin and diet Rx.  No myalgias or side effects.  F/U  LFT's in 6 months. Lab Results  Component Value Date   LDLCALC 58 06/28/2013

## 2013-10-28 NOTE — Assessment & Plan Note (Signed)
Stable with no angina and good activity level.  Continue medical Rx  

## 2013-10-28 NOTE — Patient Instructions (Signed)
Your physician wants you to follow-up in:  6 MONTHS WITH DR NISHAN  You will receive a reminder letter in the mail two months in advance. If you don't receive a letter, please call our office to schedule the follow-up appointment. Your physician recommends that you continue on your current medications as directed. Please refer to the Current Medication list given to you today. 

## 2013-10-28 NOTE — Assessment & Plan Note (Signed)
Well controlled.  Continue current medications and low sodium Dash type diet.    

## 2013-12-16 DIAGNOSIS — E559 Vitamin D deficiency, unspecified: Secondary | ICD-10-CM | POA: Diagnosis not present

## 2013-12-23 DIAGNOSIS — E559 Vitamin D deficiency, unspecified: Secondary | ICD-10-CM | POA: Diagnosis not present

## 2014-05-06 ENCOUNTER — Ambulatory Visit (INDEPENDENT_AMBULATORY_CARE_PROVIDER_SITE_OTHER): Payer: Medicare Other | Admitting: Cardiovascular Disease

## 2014-05-06 ENCOUNTER — Encounter: Payer: Self-pay | Admitting: Cardiovascular Disease

## 2014-05-06 VITALS — BP 132/78 | HR 68 | Ht 64.0 in | Wt 221.4 lb

## 2014-05-06 DIAGNOSIS — E785 Hyperlipidemia, unspecified: Secondary | ICD-10-CM | POA: Diagnosis not present

## 2014-05-06 DIAGNOSIS — I2510 Atherosclerotic heart disease of native coronary artery without angina pectoris: Secondary | ICD-10-CM | POA: Diagnosis not present

## 2014-05-06 DIAGNOSIS — I10 Essential (primary) hypertension: Secondary | ICD-10-CM | POA: Diagnosis not present

## 2014-05-06 DIAGNOSIS — I2583 Coronary atherosclerosis due to lipid rich plaque: Principal | ICD-10-CM

## 2014-05-06 MED ORDER — NITROGLYCERIN 0.4 MG SL SUBL: 0.4000 mg | SUBLINGUAL_TABLET | SUBLINGUAL | Status: DC | PRN

## 2014-05-06 NOTE — Assessment & Plan Note (Signed)
Cholesterol is at goal.  Continue current dose of statin and diet Rx.  No myalgias or side effects.  F/U  LFT's in 6 months. Lab Results  Component Value Date   LDLCALC 58 06/28/2013

## 2014-05-06 NOTE — Assessment & Plan Note (Signed)
Well controlled.  Continue current medications and low sodium Dash type diet.    

## 2014-05-06 NOTE — Progress Notes (Signed)
[name], female   DOB: 1945/12/05, 68 y.o.   MRN: [id number] 68 yo f/u for CAD  Discharge 10/20/12 with SSCP and CAD Cath by Dr Fletcher Anon  Final Conclusions:  1. Severe one-vessel coronary artery disease in the distal and proximal RCA. The LAD is also significantly calcified with mild to moderate nonobstructive disease. 2  2. Normal LV systolic function.  3. Successful angioplasty and drug-eluting stent placement to the proximal and distal RCA. This was overall a difficult procedure due to bifurcation medication distally and tortuous right coronary artery.    Works with Cassandra Castillo at [employer] Has quit smoking  I congratulated her on this   In January had some chest discomfort Not pressure not always exertional Improved since stent. Was able to do 5K walk Wants to go to gym but a bit scared No rest pain  And has not taken nitro  F/U myovue non ischemic  1/15 normal EF 71 % no ischemia   LDL 58 with normal LFTls in January    [name] is Ob doctor in  [place] is at [employer] and knows my son No chest pain.  Needs nitro  Not walking as much     ROS: Denies fever, malais, weight loss, blurry vision, decreased visual acuity, cough, sputum, SOB, hemoptysis, pleuritic pain, palpitaitons, heartburn, abdominal pain, melena, lower extremity edema, claudication, or rash.  All other systems reviewed and negative  General: Affect appropriate Overweight white female  HEENT: normal but bad dentition  Neck supple with no adenopathy JVP normal no bruits no thyromegaly Lungs clear with no wheezing and good diaphragmatic motion Heart:  S1/S2 no murmur, no rub, gallop or click PMI normal Abdomen: benighn, BS positve, no tenderness, no AAA no bruit.  No HSM or HJR Distal pulses intact with no bruits No edema Neuro non-focal Skin warm and dry No muscular weakness   Current Outpatient Prescriptions  Medication Sig Dispense Refill  . aspirin  EC 81 MG EC tablet Take 1 tablet (81 mg total) by mouth daily.    Marland Kitchen atorvastatin (LIPITOR) 80 MG tablet Take 1 tablet (80 mg total) by mouth daily at 6 PM. 30 tablet 1  . cholecalciferol (VITAMIN D) 1000 UNITS tablet Take 4,000 Units by mouth daily.    . clopidogrel (PLAVIX) 75 MG tablet Take 1 tablet (75 mg total) by mouth daily with breakfast. 30 tablet 1  . metoprolol tartrate (LOPRESSOR) 50 MG tablet Take 1 tablet (50 mg total) by mouth 2 (two) times daily. 60 tablet 11  . naproxen sodium (ANAPROX) 220 MG tablet Take 220 mg by mouth daily as needed. For pain    . nitroGLYCERIN (NITROSTAT) 0.4 MG SL tablet Place 1 tablet (0.4 mg total) under the tongue every 5 (five) minutes as needed for chest pain (up to 3 times). 30 tablet 0   No current facility-administered medications for this visit.    Allergies  Codeine and Other  Electrocardiogram:   10/28/13  SR rate 60 normal ECG  No change from 10/21/12    Assessment and Plan

## 2014-05-06 NOTE — Assessment & Plan Note (Signed)
Stable with no angina and good activity level.  Continue medical Rx Given calcified moderate residual LAD diseas will continue plavix

## 2014-05-06 NOTE — Patient Instructions (Signed)
Your physician wants you to follow-up in:  6 MONTHS WITH DR NISHAN  You will receive a reminder letter in the mail two months in advance. If you don't receive a letter, please call our office to schedule the follow-up appointment. Your physician recommends that you continue on your current medications as directed. Please refer to the Current Medication list given to you today. 

## 2014-05-24 ENCOUNTER — Other Ambulatory Visit: Payer: Self-pay | Admitting: *Deleted

## 2014-05-24 MED ORDER — CLOPIDOGREL BISULFATE 75 MG PO TABS: 75.0000 mg | ORAL_TABLET | Freq: Every day | ORAL | Status: DC

## 2014-06-02 ENCOUNTER — Encounter (HOSPITAL_COMMUNITY): Payer: Self-pay | Admitting: Cardiovascular Disease

## 2014-06-10 ENCOUNTER — Other Ambulatory Visit: Payer: Self-pay | Admitting: Cardiovascular Disease

## 2014-07-05 DIAGNOSIS — H25012 Cortical age-related cataract, left eye: Secondary | ICD-10-CM | POA: Diagnosis not present

## 2014-07-05 DIAGNOSIS — H4321 Crystalline deposits in vitreous body, right eye: Secondary | ICD-10-CM | POA: Diagnosis not present

## 2014-07-05 DIAGNOSIS — H2513 Age-related nuclear cataract, bilateral: Secondary | ICD-10-CM | POA: Diagnosis not present

## 2014-07-05 DIAGNOSIS — H524 Presbyopia: Secondary | ICD-10-CM | POA: Diagnosis not present

## 2014-07-11 ENCOUNTER — Other Ambulatory Visit: Payer: Self-pay | Admitting: Cardiovascular Disease

## 2014-09-30 DIAGNOSIS — Z1159 Encounter for screening for other viral diseases: Secondary | ICD-10-CM | POA: Diagnosis not present

## 2014-09-30 DIAGNOSIS — I2510 Atherosclerotic heart disease of native coronary artery without angina pectoris: Secondary | ICD-10-CM | POA: Diagnosis not present

## 2014-09-30 DIAGNOSIS — Z1211 Encounter for screening for malignant neoplasm of colon: Secondary | ICD-10-CM | POA: Diagnosis not present

## 2014-09-30 DIAGNOSIS — Z8349 Family history of other endocrine, nutritional and metabolic diseases: Secondary | ICD-10-CM | POA: Diagnosis not present

## 2014-09-30 DIAGNOSIS — E785 Hyperlipidemia, unspecified: Secondary | ICD-10-CM | POA: Diagnosis not present

## 2014-09-30 DIAGNOSIS — Z23 Encounter for immunization: Secondary | ICD-10-CM | POA: Diagnosis not present

## 2014-09-30 DIAGNOSIS — E559 Vitamin D deficiency, unspecified: Secondary | ICD-10-CM | POA: Diagnosis not present

## 2014-09-30 DIAGNOSIS — I10 Essential (primary) hypertension: Secondary | ICD-10-CM | POA: Diagnosis not present

## 2014-11-05 NOTE — Progress Notes (Signed)
[name], female   DOB: September 26, 1945, 69 y.o.   MRN: [id number] 69 y.o.  f/u for CAD  Discharge 10/20/12 with SSCP and CAD Cath by Dr Fletcher Anon  Final Conclusions:  1. Severe one-vessel coronary artery disease in the distal and proximal RCA. The LAD is also significantly calcified with mild to moderate nonobstructive disease. 2  2. Normal LV systolic function.  3. Successful angioplasty and drug-eluting stent placement to the proximal and distal RCA. This was overall a difficult procedure due to bifurcation medication distally and tortuous right coronary artery.   Worked  with [name] at [employer] Has quit smoking  I congratulated her on this   Last January had some chest discomfort Not pressure not always exertional Improved since stent. Was able to do 5K walk Wants to go to gym but a bit scared No rest pain  And has not taken nitro  F/U myovue non ischemic  1/15 normal EF 71 % no ischemia   LDL 58 with normal LFTls in January  but labs 10/10/14 with Dr Kenton Kingfisher LDL back up to 114 due to noncompliance  BP still high need to start additional meds and better lifestyle changes    [name] is Ob doctor in  [place] is at [employer] [place] and knows my son No chest pain.  Needs nitro  Not walking as much     ROS: Denies fever, malais, weight loss, blurry vision, decreased visual acuity, cough, sputum, SOB, hemoptysis, pleuritic pain, palpitaitons, heartburn, abdominal pain, melena, lower extremity edema, claudication, or rash.  All other systems reviewed and negative  General: Affect appropriate Overweight white female  HEENT: normal but bad dentition  Neck supple with no adenopathy JVP normal no bruits no thyromegaly Lungs clear with no wheezing and good diaphragmatic motion Heart:  S1/S2 no murmur, no rub, gallop or click PMI normal Abdomen: benighn, BS positve, no tenderness, no AAA no bruit.  No HSM or HJR Distal pulses intact with no  bruits No edema Neuro non-focal Skin warm and dry No muscular weakness   Current Outpatient Prescriptions  Medication Sig Dispense Refill  . aspirin EC 81 MG EC tablet Take 1 tablet (81 mg total) by mouth daily.    Marland Kitchen atorvastatin (LIPITOR) 80 MG tablet Take 1 tablet (80 mg total) by mouth daily at 6 PM. 30 tablet 1  . cholecalciferol (VITAMIN D) 1000 UNITS tablet Take 4,000 Units by mouth daily.    . clopidogrel (PLAVIX) 75 MG tablet Take 1 tablet (75 mg total) by mouth daily with breakfast. 30 tablet 5  . metoprolol (LOPRESSOR) 50 MG tablet TAKE ONE TABLET BY MOUTH TWICE DAILY 60 tablet 3  . naproxen sodium (ANAPROX) 220 MG tablet Take 220 mg by mouth daily as needed. For pain    . nitroGLYCERIN (NITROSTAT) 0.4 MG SL tablet Place 1 tablet (0.4 mg total) under the tongue every 5 (five) minutes as needed for chest pain (up to 3 times). 30 tablet 0   No current facility-administered medications for this visit.    Allergies  Codeine and Other  Electrocardiogram:   10/28/13  SR rate 60 normal ECG  No change from 10/21/12   11/07/14  SR rate 59  Normal    Assessment and Plan HTN:  Add hyzaar 50/12.5  BMET in 3 weeks f/u me next available  BMET normla 10/06/14 K 4.4 Cr .72   CAD:  Stable with no angina and good activity level.  Continue medical Rx Chol:  Back on statin repeat labs with primary in 3 months   Jenkins Rouge

## 2014-11-07 ENCOUNTER — Ambulatory Visit (INDEPENDENT_AMBULATORY_CARE_PROVIDER_SITE_OTHER): Payer: Medicare Other | Admitting: Cardiovascular Disease

## 2014-11-07 ENCOUNTER — Encounter: Payer: Self-pay | Admitting: Cardiovascular Disease

## 2014-11-07 VITALS — BP 164/82 | HR 59 | Ht 64.0 in | Wt 221.0 lb

## 2014-11-07 DIAGNOSIS — I10 Essential (primary) hypertension: Secondary | ICD-10-CM

## 2014-11-07 MED ORDER — LOSARTAN POTASSIUM-HCTZ 50-12.5 MG PO TABS: 1.0000 | ORAL_TABLET | Freq: Every day | ORAL | Status: DC

## 2014-11-07 NOTE — Patient Instructions (Signed)
Medication Instructions:  START LOSARATAN   HCTZ 50/12.5 MG EVERY  DAY   Labwork: BMET IN  3  WEEKS  Testing/Procedures: NONE  Follow-Up: NEXT  AVAILABLE WITH  DR Johnsie Cancel  Any Other Special Instructions Will Be Listed Below (If Applicable). ]

## 2014-11-10 ENCOUNTER — Other Ambulatory Visit: Payer: Self-pay | Admitting: Cardiovascular Disease

## 2014-11-21 ENCOUNTER — Other Ambulatory Visit: Payer: Self-pay | Admitting: Cardiovascular Disease

## 2014-11-23 ENCOUNTER — Other Ambulatory Visit (INDEPENDENT_AMBULATORY_CARE_PROVIDER_SITE_OTHER): Payer: Medicare Other | Admitting: *Deleted

## 2014-11-23 DIAGNOSIS — I10 Essential (primary) hypertension: Secondary | ICD-10-CM

## 2014-11-23 LAB — BASIC METABOLIC PANEL
BUN: 13 mg/dL (ref 6–23)
CHLORIDE: 100 meq/L (ref 96–112)
CO2: 31 mEq/L (ref 19–32)
Calcium: 9.2 mg/dL (ref 8.4–10.5)
Creatinine, Ser: 0.79 mg/dL (ref 0.40–1.20)
GFR: 77 mL/min (ref >60.00)
Glucose, Bld: 108 mg/dL — ABNORMAL HIGH (ref 70–99)
Potassium: 4.5 mEq/L (ref 3.5–5.1)
Sodium: 135 mEq/L (ref 135–145)

## 2015-01-21 NOTE — Progress Notes (Signed)
[name], female   DOB: [birth year], 69 y.o.   MRN: [id number] 69 y.o.  f/u for CAD  Diagnosed  10/20/12 with SSCP  Cath by Dr Fletcher Anon  Final Conclusions:  1. Severe one-vessel coronary artery disease in the distal and proximal RCA. The LAD is also significantly calcified with mild to moderate nonobstructive disease. 2  2. Normal LV systolic function.  3. Successful angioplasty and drug-eluting stent placement to the proximal and distal RCA. This was overall a difficult procedure due to bifurcation medication distally and tortuous right coronary artery.   Worked  with [name] at [employer] Has quit smoking  I congratulated her on this   F/U myovue non ischemic  1/15 normal EF 71 % no ischemia   Labs 10/10/14 with Dr Kenton Kingfisher LDL back up to 114 due to noncompliance  BP up 10/2014 and Hyzaar added  Daughter Darnelle Derrick is Ob doctor in  [place] is at [employer] and knows my son No chest pain.  Needs nitro  Not walking as much   ROS: Denies fever, malais, weight loss, blurry vision, decreased visual acuity, cough, sputum, SOB, hemoptysis, pleuritic pain, palpitaitons, heartburn, abdominal pain, melena, lower extremity edema, claudication, or rash.  All other systems reviewed and negative  General: Affect appropriate Overweight white female  HEENT: normal but bad dentition  Neck supple with no adenopathy JVP normal no bruits no thyromegaly Lungs clear with no wheezing and good diaphragmatic motion Heart:  S1/S2 no murmur, no rub, gallop or click PMI normal Abdomen: benighn, BS positve, no tenderness, no AAA no bruit.  No HSM or HJR Distal pulses intact with no bruits No edema Neuro non-focal Skin warm and dry No muscular weakness   Current Outpatient Prescriptions  Medication Sig Dispense Refill  . aspirin EC 81 MG EC tablet Take 1 tablet (81 mg total) by mouth daily.    Marland Kitchen atorvastatin (LIPITOR) 80 MG tablet Take 1 tablet (80 mg total) by mouth  daily at 6 PM. 30 tablet 1  . cholecalciferol (VITAMIN D) 1000 UNITS tablet Take 4,000 Units by mouth daily.    . clopidogrel (PLAVIX) 75 MG tablet TAKE ONE TABLET BY MOUTH ONCE DAILY WITH BREAKFAST 30 tablet 11  . losartan-hydrochlorothiazide (HYZAAR) 50-12.5 MG per tablet Take 1 tablet by mouth daily. 30 tablet 11  . metoprolol (LOPRESSOR) 50 MG tablet TAKE ONE TABLET BY MOUTH TWICE DAILY 60 tablet 5  . naproxen sodium (ANAPROX) 220 MG tablet Take 220 mg by mouth daily as needed. For pain    . nitroGLYCERIN (NITROSTAT) 0.4 MG SL tablet Place 1 tablet (0.4 mg total) under the tongue every 5 (five) minutes as needed for chest pain (up to 3 times). 30 tablet 0   No current facility-administered medications for this visit.    Allergies  Codeine and Other  Electrocardiogram:   10/28/13  SR rate 60 normal ECG  No change from 10/21/12   11/07/14  SR rate 59  Normal    Assessment and Plan HTN:  Improved with Hyzaar  CAD:  Stable with no angina and good activity level.  Continue medical Rx Chol:  Back on statin labs in 6 months   Jenkins Rouge

## 2015-01-23 ENCOUNTER — Encounter: Payer: Self-pay | Admitting: Cardiovascular Disease

## 2015-01-23 ENCOUNTER — Ambulatory Visit (INDEPENDENT_AMBULATORY_CARE_PROVIDER_SITE_OTHER): Payer: Medicare Other | Admitting: Cardiovascular Disease

## 2015-01-23 VITALS — BP 140/76 | HR 56 | Ht 64.0 in | Wt 208.0 lb

## 2015-01-23 DIAGNOSIS — I2510 Atherosclerotic heart disease of native coronary artery without angina pectoris: Secondary | ICD-10-CM | POA: Diagnosis not present

## 2015-01-23 DIAGNOSIS — I2583 Coronary atherosclerosis due to lipid rich plaque: Principal | ICD-10-CM

## 2015-01-23 NOTE — Patient Instructions (Addendum)
Medication Instructions:  NO CHANGES  Labwork: NONE  Testing/Procedures: NONE  Follow-Up: Your physician wants you to follow-up in: 6 MONTHS WITH DR NISHAN You will receive a reminder letter in the mail two months in advance. If you don't receive a letter, please call our office to schedule the follow-up appointment.  Any Other Special Instructions Will Be Listed Below (If Applicable).   

## 2015-05-10 DIAGNOSIS — R05 Cough: Secondary | ICD-10-CM | POA: Diagnosis not present

## 2015-05-10 DIAGNOSIS — J069 Acute upper respiratory infection, unspecified: Secondary | ICD-10-CM | POA: Diagnosis not present

## 2015-05-11 ENCOUNTER — Other Ambulatory Visit: Payer: Self-pay | Admitting: Cardiovascular Disease

## 2015-06-06 DIAGNOSIS — N390 Urinary tract infection, site not specified: Secondary | ICD-10-CM | POA: Diagnosis not present

## 2015-06-08 DIAGNOSIS — E785 Hyperlipidemia, unspecified: Secondary | ICD-10-CM | POA: Diagnosis not present

## 2015-06-08 DIAGNOSIS — I10 Essential (primary) hypertension: Secondary | ICD-10-CM | POA: Diagnosis not present

## 2015-06-08 DIAGNOSIS — I2510 Atherosclerotic heart disease of native coronary artery without angina pectoris: Secondary | ICD-10-CM | POA: Diagnosis not present

## 2015-07-19 NOTE — Progress Notes (Signed)
[name], female   DOB: [birth year], 70 y.o.   MRN: [id number] 70 y.o.  f/u for CAD  Diagnosed  10/20/12 with SSCP  Cath by Dr Fletcher Anon  Final Conclusions:  1. Severe one-vessel coronary artery disease in the distal and proximal RCA. The LAD is also significantly calcified with mild to moderate nonobstructive disease. 2  2. Normal LV systolic function.  3. Successful angioplasty and drug-eluting stent placement to the proximal and distal RCA. This was overall a difficult procedure due to bifurcation medication distally and tortuous right coronary artery.   Worked  with Cassandra Castillo at [facility] Has quit smoking  I congratulated her on this   F/U myovue non ischemic  1/15 normal EF 71 % no ischemia   Labs 10/10/14 with Dr Kenton Kingfisher LDL back up to 114 due to noncompliance  BP up 10/2014 and Hyzaar added  [name] is Ob doctor in  [place] is at [employer] [place] and knows my son No chest pain.  Needs nitro  Not walking as much   ROS: Denies fever, malais, weight loss, blurry vision, decreased visual acuity, cough, sputum, SOB, hemoptysis, pleuritic pain, palpitaitons, heartburn, abdominal pain, melena, lower extremity edema, claudication, or rash.  All other systems reviewed and negative  General: Affect appropriate Overweight white female  HEENT: normal but bad dentition  Neck supple with no adenopathy JVP normal no bruits no thyromegaly Lungs clear with no wheezing and good diaphragmatic motion Heart:  S1/S2 no murmur, no rub, gallop or click PMI normal Abdomen: benighn, BS positve, no tenderness, no AAA no bruit.  No HSM or HJR Distal pulses intact with no bruits No edema Neuro non-focal Skin warm and dry No muscular weakness   Current Outpatient Prescriptions  Medication Sig Dispense Refill  . aspirin EC 81 MG EC tablet Take 1 tablet (81 mg total) by mouth daily.    Marland Kitchen atorvastatin (LIPITOR) 80 MG tablet Take 1 tablet (80 mg total) by mouth  daily at 6 PM. 30 tablet 1  . cholecalciferol (VITAMIN D) 1000 UNITS tablet Take 4,000 Units by mouth daily.    . clopidogrel (PLAVIX) 75 MG tablet TAKE ONE TABLET BY MOUTH ONCE DAILY WITH BREAKFAST 30 tablet 11  . losartan-hydrochlorothiazide (HYZAAR) 50-12.5 MG per tablet Take 1 tablet by mouth daily. 30 tablet 11  . metoprolol (LOPRESSOR) 50 MG tablet TAKE ONE TABLET BY MOUTH TWICE DAILY 60 tablet 3  . naproxen sodium (ANAPROX) 220 MG tablet Take 220 mg by mouth daily as needed. For pain    . nitroGLYCERIN (NITROSTAT) 0.4 MG SL tablet Place 1 tablet (0.4 mg total) under the tongue every 5 (five) minutes as needed for chest pain (up to 3 times). 30 tablet 0   No current facility-administered medications for this visit.    Allergies  Codeine and Other  Electrocardiogram:   10/28/13  SR rate 60 normal ECG  No change from 10/21/12   11/07/14  SR rate 59  Normal    Assessment and Plan HTN:  Improved with Hyzaar  CAD:  Stable with no angina and good activity level.  Continue medical Rx Chol:  Back on statin labs in 6 months  Lab Results  Component Value Date   LDLCALC 58 06/28/2013     Cassandra Castillo

## 2015-07-25 ENCOUNTER — Ambulatory Visit (INDEPENDENT_AMBULATORY_CARE_PROVIDER_SITE_OTHER): Payer: Medicare Other | Admitting: Cardiovascular Disease

## 2015-07-25 ENCOUNTER — Encounter: Payer: Self-pay | Admitting: Cardiovascular Disease

## 2015-07-25 ENCOUNTER — Encounter (INDEPENDENT_AMBULATORY_CARE_PROVIDER_SITE_OTHER): Payer: Self-pay

## 2015-07-25 VITALS — BP 120/64 | HR 53 | Ht 64.0 in | Wt 213.0 lb

## 2015-07-25 DIAGNOSIS — I2583 Coronary atherosclerosis due to lipid rich plaque: Principal | ICD-10-CM

## 2015-07-25 DIAGNOSIS — I10 Essential (primary) hypertension: Secondary | ICD-10-CM

## 2015-07-25 DIAGNOSIS — I2510 Atherosclerotic heart disease of native coronary artery without angina pectoris: Secondary | ICD-10-CM

## 2015-07-25 NOTE — Patient Instructions (Signed)

## 2015-08-06 ENCOUNTER — Encounter (HOSPITAL_COMMUNITY): Payer: Self-pay | Admitting: Emergency Medicine

## 2015-08-06 DIAGNOSIS — Z79899 Other long term (current) drug therapy: Secondary | ICD-10-CM | POA: Insufficient documentation

## 2015-08-06 DIAGNOSIS — M719 Bursopathy, unspecified: Secondary | ICD-10-CM | POA: Diagnosis not present

## 2015-08-06 DIAGNOSIS — Z9889 Other specified postprocedural states: Secondary | ICD-10-CM | POA: Diagnosis not present

## 2015-08-06 DIAGNOSIS — Y9389 Activity, other specified: Secondary | ICD-10-CM | POA: Diagnosis not present

## 2015-08-06 DIAGNOSIS — E785 Hyperlipidemia, unspecified: Secondary | ICD-10-CM | POA: Diagnosis not present

## 2015-08-06 DIAGNOSIS — I10 Essential (primary) hypertension: Secondary | ICD-10-CM | POA: Insufficient documentation

## 2015-08-06 DIAGNOSIS — Z87440 Personal history of urinary (tract) infections: Secondary | ICD-10-CM | POA: Insufficient documentation

## 2015-08-06 DIAGNOSIS — Z7901 Long term (current) use of anticoagulants: Secondary | ICD-10-CM | POA: Diagnosis not present

## 2015-08-06 DIAGNOSIS — B9689 Other specified bacterial agents as the cause of diseases classified elsewhere: Secondary | ICD-10-CM | POA: Diagnosis not present

## 2015-08-06 DIAGNOSIS — Z7982 Long term (current) use of aspirin: Secondary | ICD-10-CM | POA: Diagnosis not present

## 2015-08-06 DIAGNOSIS — I25119 Atherosclerotic heart disease of native coronary artery with unspecified angina pectoris: Secondary | ICD-10-CM | POA: Diagnosis not present

## 2015-08-06 DIAGNOSIS — M7032 Other bursitis of elbow, left elbow: Secondary | ICD-10-CM | POA: Insufficient documentation

## 2015-08-06 DIAGNOSIS — R2232 Localized swelling, mass and lump, left upper limb: Secondary | ICD-10-CM | POA: Diagnosis present

## 2015-08-06 DIAGNOSIS — L03114 Cellulitis of left upper limb: Secondary | ICD-10-CM | POA: Insufficient documentation

## 2015-08-06 DIAGNOSIS — R509 Fever, unspecified: Secondary | ICD-10-CM | POA: Diagnosis not present

## 2015-08-06 DIAGNOSIS — Z87891 Personal history of nicotine dependence: Secondary | ICD-10-CM | POA: Diagnosis not present

## 2015-08-06 DIAGNOSIS — Y939 Activity, unspecified: Secondary | ICD-10-CM | POA: Diagnosis not present

## 2015-08-06 DIAGNOSIS — Z792 Long term (current) use of antibiotics: Secondary | ICD-10-CM | POA: Insufficient documentation

## 2015-08-06 DIAGNOSIS — M7989 Other specified soft tissue disorders: Secondary | ICD-10-CM | POA: Diagnosis not present

## 2015-08-06 LAB — COMPREHENSIVE METABOLIC PANEL
ALT: 15 U/L (ref 14–54)
AST: 19 U/L (ref 15–41)
Albumin: 3.9 g/dL (ref 3.5–5.0)
Alkaline Phosphatase: 108 U/L (ref 38–126)
Anion gap: 12 (ref 5–15)
BILIRUBIN TOTAL: 1 mg/dL (ref 0.3–1.2)
BUN: 15 mg/dL (ref 6–20)
CALCIUM: 9.7 mg/dL (ref 8.9–10.3)
CO2: 29 mmol/L (ref 22–32)
Chloride: 99 mmol/L — ABNORMAL LOW (ref 101–111)
Creatinine, Ser: 0.75 mg/dL (ref 0.44–1.00)
GFR calc Af Amer: >60 mL/min (ref >60)
Glucose, Bld: 112 mg/dL — ABNORMAL HIGH (ref 65–99)
POTASSIUM: 4.4 mmol/L (ref 3.5–5.1)
Sodium: 140 mmol/L (ref 135–145)
TOTAL PROTEIN: 6.5 g/dL (ref 6.5–8.1)

## 2015-08-06 LAB — CBC
HCT: 41.3 % (ref 36.0–46.0)
HEMOGLOBIN: 13.9 g/dL (ref 12.0–15.0)
MCH: 30.7 pg (ref 26.0–34.0)
MCHC: 33.7 g/dL (ref 30.0–36.0)
MCV: 91.2 fL (ref 78.0–100.0)
PLATELETS: 284 10*3/uL (ref 150–400)
RBC: 4.53 MIL/uL (ref 3.87–5.11)
RDW: 13.3 % (ref 11.5–15.5)
WBC: 12.3 10*3/uL — ABNORMAL HIGH (ref 4.0–10.5)

## 2015-08-06 LAB — I-STAT CG4 LACTIC ACID, ED: LACTIC ACID, VENOUS: 0.55 mmol/L (ref 0.5–2.0)

## 2015-08-06 NOTE — ED Notes (Signed)
Pt states yesterday she woke up and her L elbow was tender "it has a fever in it". Its "just gotten bigger and bigger". L elbow is swollen and warm to the touch. Pt in NAD.

## 2015-08-07 ENCOUNTER — Emergency Department (HOSPITAL_COMMUNITY): Payer: Medicare Other

## 2015-08-07 ENCOUNTER — Emergency Department (HOSPITAL_COMMUNITY)
Admission: EM | Admit: 2015-08-07 | Discharge: 2015-08-07 | Disposition: A | Discharge status: Home / Self Care | Payer: Medicare Other | Attending: Emergency Medicine | Admitting: Emergency Medicine

## 2015-08-07 DIAGNOSIS — M7022 Olecranon bursitis, left elbow: Secondary | ICD-10-CM

## 2015-08-07 DIAGNOSIS — L03114 Cellulitis of left upper limb: Secondary | ICD-10-CM

## 2015-08-07 DIAGNOSIS — M7989 Other specified soft tissue disorders: Secondary | ICD-10-CM | POA: Diagnosis not present

## 2015-08-07 DIAGNOSIS — M7032 Other bursitis of elbow, left elbow: Secondary | ICD-10-CM | POA: Diagnosis not present

## 2015-08-07 HISTORY — DX: Essential (primary) hypertension: I10

## 2015-08-07 LAB — I-STAT CG4 LACTIC ACID, ED: Lactic Acid, Venous: 3.22 mmol/L (ref 0.5–2.0)

## 2015-08-07 LAB — LACTIC ACID, PLASMA: Lactic Acid, Venous: 1 mmol/L (ref 0.5–2.0)

## 2015-08-07 MED ORDER — CLINDAMYCIN HCL 300 MG PO CAPS
300.0000 mg | ORAL_CAPSULE | Freq: Four times a day (QID) | ORAL | Status: DC
Start: 2015-08-07 — End: 2016-02-06

## 2015-08-07 MED ORDER — CLINDAMYCIN HCL 150 MG PO CAPS
300.0000 mg | ORAL_CAPSULE | Freq: Once | ORAL | Status: AC
  Administered 2015-08-07: 300 mg via ORAL
  Filled 2015-08-07: qty 2

## 2015-08-07 MED ORDER — SODIUM CHLORIDE 0.9 % IV BOLUS (SEPSIS)
1000.0000 mL | Freq: Once | INTRAVENOUS | Status: AC
  Administered 2015-08-07: 1000 mL via INTRAVENOUS

## 2015-08-07 NOTE — ED Notes (Signed)
Dr. Gardner at bedside 

## 2015-08-07 NOTE — ED Notes (Signed)
Patient left at this time with all belongings. 

## 2015-08-07 NOTE — ED Provider Notes (Signed)
CSN: [id number]     Arrival date & time 08/06/15  2051 History  By signing my name below, I, Cassandra Castillo, attest that this documentation has been prepared under the direction and in the presence of Cassandra Gambler, MD Electronically Signed: Evonnie Castillo, ED Scribe 08/07/2015 at 2:27 AM.    Chief Complaint  Patient presents with  . Arm Swelling   The history is provided by the patient. No language interpreter was used.  HPI Comments: Cassandra Castillo is a 70 y.o. female with a pmhx of HLD, CAD, and HTN, and a pshx of a left heart catherization, who presents to the Emergency Department complaining of slight, constant, gradual onset left elbow tenderness, swelling and warmth that began yesterday when she woke up. Swelling began on the elbow and has radiated outwards since then. Pt reports a subjective fever of Tmax 100.44F. Pt can bend and straighten her elbow with some 7/10 discomfort. Pt took Aleve with some relief. Pt recently had a UTI and was prescribed Cipro and developed a fever after diagnosis. Pt is currently taking Plavix and Nitrostat. Pt has no antibiotic allergies.    Past Medical History  Diagnosis Date  . Hyperlipidemia   . Tobacco abuse   . Thyroid disease     Has had to intermittently take thyroid medicine in the past  . Coronary artery disease   . Anginal pain ([place])   . Hypertension    Past Surgical History  Procedure Laterality Date  . Tonsillectomy    . Breast lumpectomy    . Cholecystectomy    . Tubal ligation    . Left heart catheterization with coronary angiogram N/A 10/21/2012    Procedure: LEFT HEART CATHETERIZATION WITH CORONARY ANGIOGRAM;  Surgeon: Wellington Hampshire, MD;  Location: [facility]ATH [facility];  Service: Cardiovascular;  Laterality: N/A;   Family History  Problem Relation Age of Onset  . Coronary artery disease Father 54    CABG x4 at age 40, repeat 2 vessel CABG at age 7. Died of congestive heart failure 10 years later  . Heart failure Father   .  Coronary artery disease Paternal Uncle   . Hyperlipidemia Brother   . Hemochromatosis Mother   . COPD Mother    Social History  Substance Use Topics  . Smoking status: Former Smoker -- 1.00 packs/day for 40 years    Types: Cigarettes  . Smokeless tobacco: None  . Alcohol Use: No   OB History    No data available     Review of Systems  Constitutional: Positive for fever.  Musculoskeletal: Positive for joint swelling and arthralgias.  All other systems reviewed and are negative.     Allergies  Codeine and Other  Home Medications   Prior to Admission medications   Medication Sig Start Date End Date Taking? Authorizing Provider  aspirin EC 81 MG EC tablet Take 1 tablet (81 mg total) by mouth daily. 10/22/12  Yes Cassandra Boyer, MD  atorvastatin (LIPITOR) 80 MG tablet Take 1 tablet (80 mg total) by mouth daily at 6 PM. 10/22/12  Yes Cassandra Bobbie Stack, MD  cholecalciferol (VITAMIN D) 1000 UNITS tablet Take 4,000 Units by mouth daily.   Yes Historical Provider, MD  clopidogrel (PLAVIX) 75 MG tablet TAKE ONE TABLET BY MOUTH ONCE DAILY WITH BREAKFAST 11/23/14  Yes Cassandra Hector, MD  losartan-hydrochlorothiazide (HYZAAR) 50-12.5 MG per tablet Take 1 tablet by mouth daily. 11/07/14  Yes Cassandra Hector, MD  metoprolol (LOPRESSOR) 50 MG tablet TAKE  ONE TABLET BY MOUTH TWICE DAILY 05/11/15  Yes Cassandra Hector, MD  naproxen sodium (ANAPROX) 220 MG tablet Take 220 mg by mouth daily as needed. For pain   Yes Historical Provider, MD  nitroGLYCERIN (NITROSTAT) 0.4 MG SL tablet Place 1 tablet (0.4 mg total) under the tongue every 5 (five) minutes as needed for chest pain (up to 3 times). 05/06/14  Yes Cassandra Hector, MD   BP 151/79 mmHg  Pulse 74  Temp(Src) 98.4 F (36.9 C) (Oral)  Resp 16  SpO2 97% Physical Exam  Constitutional: She is oriented to person, place, and time. She appears well-developed and well-nourished.  HENT:  Head: Normocephalic and atraumatic.  Right Ear: External ear normal.   Left Ear: External ear normal.  Nose: Nose normal.  Eyes: Right eye exhibits no discharge. Left eye exhibits no discharge.  Cardiovascular: Normal rate, regular rhythm and normal heart sounds.   2+ radial pulses b/l  Pulmonary/Chest: Effort normal and breath sounds normal.  Abdominal: Soft. There is no tenderness.  Musculoskeletal:       Left elbow: She exhibits swelling. She exhibits normal range of motion. Tenderness found.       Arms: Normal strength and sensation in LUE. Normal passive and active ROM of left elbow.  Neurological: She is alert and oriented to person, place, and time.  Skin: Skin is warm and dry.  Nursing note and vitals reviewed.   ED Course  Procedures  DIAGNOSTIC STUDIES: Oxygen Saturation is 99% on RA, normal by my interpretation.    COORDINATION OF CARE: 1:19 AM-Discussed treatment plan which includes DG left elbow, blood work, and screening for possible sepsis, as well as administration of antibiotics with pt at bedside and pt agreed to plan.   Labs Review Labs Reviewed  COMPREHENSIVE METABOLIC PANEL - Abnormal; Notable for the following:    Chloride 99 (*)    Glucose, Bld 112 (*)    All other components within normal limits  CBC - Abnormal; Notable for the following:    WBC 12.3 (*)    All other components within normal limits  I-STAT CG4 LACTIC ACID, ED - Abnormal; Notable for the following:    Lactic Acid, Venous 3.22 (*)    All other components within normal limits  CULTURE, BLOOD (ROUTINE X 2)  CULTURE, BLOOD (ROUTINE X 2)  LACTIC ACID, PLASMA  I-STAT CG4 LACTIC ACID, ED    Imaging Review Dg Elbow Complete Left  08/07/2015  CLINICAL DATA:  Elbow swelling starting yesterday. Turning red tonight. Posterior elbow. No known injury. EXAM: LEFT ELBOW - COMPLETE 3+ VIEW COMPARISON:  None. FINDINGS: Degenerative changes in the left elbow. No evidence of acute fracture or dislocation. No focal bone lesion or bone destruction. There is infiltration in  the subcutaneous fat posterior to the left elbow and forearm. This may represent edema or cellulitis. No radiopaque soft tissue foreign bodies or soft tissue gas collections identified. No significant elbow effusion. IMPRESSION: Degenerative changes in the left elbow. No acute bony abnormalities. Infiltration in the subcutaneous fat posterior to the left elbow may indicate edema or cellulitis. Electronically Signed   By: Lucienne Capers M.D.   On: 08/07/2015 02:14   I have personally reviewed and evaluated these images and lab results as part of my medical decision-making.   EKG Interpretation None      MDM   Final diagnoses:  Bursitis of elbow, left  Left arm cellulitis   Patient's presentation is consistent with an infected bursa.  No joint effusion or decreased ROM to suggest septic arthritis. Appears well with no systemic signs. Mild WBC elevation but otherwise appears well. Declines pain meds. Initial lactate normal. While getting xray a 2nd lactate was obtained by lab (by protocol) and is elevated. Does not fit clinically, but given significant elevation will consult hospitalist.   3:15 AM Consulted hospitalist due to elevated lactate. Dr. Alcario Drought requests true lab lactate given this does not fit clinically and initial was normal.  Lab lactate is normal. This correlates with presentation and she has had no hypotension, tachycardia or other signs of sepsis. I think the value of 3.22 was a lab error. Will treat with oral antibiotics and ortho follow up for possible drainage if not improving. Discussed strict return precautions.   I personally performed the services described in this documentation, which was scribed in my presence. The recorded information has been reviewed and is accurate.      Cassandra Gambler, MD 08/07/15 1450

## 2015-08-07 NOTE — ED Notes (Signed)
MD at bedside. 

## 2015-08-08 DIAGNOSIS — L03114 Cellulitis of left upper limb: Secondary | ICD-10-CM | POA: Diagnosis not present

## 2015-08-09 DIAGNOSIS — L03114 Cellulitis of left upper limb: Secondary | ICD-10-CM | POA: Diagnosis not present

## 2015-08-09 DIAGNOSIS — R300 Dysuria: Secondary | ICD-10-CM | POA: Diagnosis not present

## 2015-08-10 DIAGNOSIS — L03114 Cellulitis of left upper limb: Secondary | ICD-10-CM | POA: Diagnosis not present

## 2015-08-12 LAB — CULTURE, BLOOD (ROUTINE X 2)
CULTURE: NO GROWTH
CULTURE: NO GROWTH

## 2015-08-14 DIAGNOSIS — L03114 Cellulitis of left upper limb: Secondary | ICD-10-CM | POA: Diagnosis not present

## 2015-08-21 DIAGNOSIS — L03114 Cellulitis of left upper limb: Secondary | ICD-10-CM | POA: Diagnosis not present

## 2015-08-29 DIAGNOSIS — L03114 Cellulitis of left upper limb: Secondary | ICD-10-CM | POA: Diagnosis not present

## 2015-09-08 ENCOUNTER — Other Ambulatory Visit: Payer: Self-pay | Admitting: Cardiovascular Disease

## 2015-10-31 ENCOUNTER — Other Ambulatory Visit: Payer: Self-pay | Admitting: Cardiovascular Disease

## 2015-11-02 ENCOUNTER — Other Ambulatory Visit: Payer: Self-pay | Admitting: Cardiovascular Disease

## 2015-12-07 DIAGNOSIS — E559 Vitamin D deficiency, unspecified: Secondary | ICD-10-CM | POA: Diagnosis not present

## 2015-12-07 DIAGNOSIS — I10 Essential (primary) hypertension: Secondary | ICD-10-CM | POA: Diagnosis not present

## 2015-12-07 DIAGNOSIS — I2510 Atherosclerotic heart disease of native coronary artery without angina pectoris: Secondary | ICD-10-CM | POA: Diagnosis not present

## 2015-12-07 DIAGNOSIS — Z23 Encounter for immunization: Secondary | ICD-10-CM | POA: Diagnosis not present

## 2015-12-07 DIAGNOSIS — E785 Hyperlipidemia, unspecified: Secondary | ICD-10-CM | POA: Diagnosis not present

## 2015-12-07 DIAGNOSIS — Z0000 Encounter for general adult medical examination without abnormal findings: Secondary | ICD-10-CM | POA: Diagnosis not present

## 2015-12-07 DIAGNOSIS — Z1211 Encounter for screening for malignant neoplasm of colon: Secondary | ICD-10-CM | POA: Diagnosis not present

## 2015-12-22 DIAGNOSIS — Z1211 Encounter for screening for malignant neoplasm of colon: Secondary | ICD-10-CM | POA: Diagnosis not present

## 2016-02-05 NOTE — Progress Notes (Signed)
[name], female   DOB: 03-Oct-1945, 70 y.o.   MRN: [id number] 70 y.o.  f/u for CAD  Diagnosed  10/20/12 with SSCP  Cath by Dr Fletcher Anon  Final Conclusions:  1. Severe one-vessel coronary artery disease in the distal and proximal RCA. The LAD is also significantly calcified with mild to moderate nonobstructive disease. 2  2. Normal LV systolic function.  3. Successful angioplasty and drug-eluting stent placement to the proximal and distal RCA. This was overall a difficult procedure due to bifurcation medication distally and tortuous right coronary artery.   Worked  with [name] at [employer] Has quit smoking  I congratulated her on this   F/U myovue non ischemic  1/15 normal EF 71 % no ischemia   Labs 10/10/14 with Dr Kenton Kingfisher LDL back up to 114 due to noncompliance  BP up 10/2014 and Hyzaar added  [name] is Ob doctor in  [place] is at [employer] [place] and knows my son  Had a wonderful 3.5 week trip cross country with 6 of her grandkids and daughter She has no SSCP with exertion but some "pressure" on waking in am and at rest  ROS: Denies fever, malais, weight loss, blurry vision, decreased visual acuity, cough, sputum, SOB, hemoptysis, pleuritic pain, palpitaitons, heartburn, abdominal pain, melena, lower extremity edema, claudication, or rash.  All other systems reviewed and negative  General: Affect appropriate Overweight white female  HEENT: normal but bad dentition  Neck supple with no adenopathy JVP normal no bruits no thyromegaly Lungs clear with no wheezing and good diaphragmatic motion Heart:  S1/S2 no murmur, no rub, gallop or click PMI normal Abdomen: benighn, BS positve, no tenderness, no AAA no bruit.  No HSM or HJR Distal pulses intact with no bruits No edema Neuro non-focal Skin warm and dry No muscular weakness   Current Outpatient Prescriptions  Medication Sig Dispense Refill  . aspirin EC 81 MG EC tablet Take 1  tablet (81 mg total) by mouth daily.    Marland Kitchen atorvastatin (LIPITOR) 80 MG tablet Take 1 tablet (80 mg total) by mouth daily at 6 PM. 30 tablet 1  . cholecalciferol (VITAMIN D) 1000 UNITS tablet Take 4,000 Units by mouth daily.    . clopidogrel (PLAVIX) 75 MG tablet TAKE ONE TABLET BY MOUTH ONCE DAILY WITH BREAKFAST 30 tablet 6  . losartan-hydrochlorothiazide (HYZAAR) 50-12.5 MG tablet TAKE ONE TABLET BY MOUTH ONCE DAILY 30 tablet 6  . metoprolol (LOPRESSOR) 50 MG tablet TAKE ONE TABLET BY MOUTH TWICE DAILY 60 tablet 9  . naproxen sodium (ANAPROX) 220 MG tablet Take 220 mg by mouth daily as needed. For pain    . nitroGLYCERIN (NITROSTAT) 0.4 MG SL tablet Place 1 tablet (0.4 mg total) under the tongue every 5 (five) minutes as needed for chest pain (up to 3 times). 30 tablet 0   No current facility-administered medications for this visit.     Allergies  Codeine and Other  Electrocardiogram:   10/28/13  SR rate 60 normal ECG  No change from 10/21/12   11/07/14  SR rate 59  Normal  02/06/16  SB rate 49  Normal    Assessment and Plan HTN:  Improved with Hyzaar  CAD:  Stable with no angina and good activity level.  Continue medical Rx Some Resting symptoms not likely angina given how active she is with no pains but Will do exercise myovue in January will be 3 years since last one  Chol:  Back on statin labs in 6 months  Lab Results  Component Value Date   LDLCALC 58 06/28/2013     Jenkins Rouge

## 2016-02-06 ENCOUNTER — Encounter (INDEPENDENT_AMBULATORY_CARE_PROVIDER_SITE_OTHER): Payer: Self-pay

## 2016-02-06 ENCOUNTER — Ambulatory Visit (INDEPENDENT_AMBULATORY_CARE_PROVIDER_SITE_OTHER): Payer: Medicare Other | Admitting: Cardiovascular Disease

## 2016-02-06 ENCOUNTER — Encounter: Payer: Self-pay | Admitting: Cardiovascular Disease

## 2016-02-06 VITALS — BP 136/80 | HR 70 | Resp 18 | Ht 64.0 in | Wt 205.0 lb

## 2016-02-06 DIAGNOSIS — I2510 Atherosclerotic heart disease of native coronary artery without angina pectoris: Secondary | ICD-10-CM | POA: Diagnosis not present

## 2016-02-06 DIAGNOSIS — I2583 Coronary atherosclerosis due to lipid rich plaque: Principal | ICD-10-CM

## 2016-02-06 NOTE — Patient Instructions (Addendum)
Medication Instructions:  Your physician recommends that you continue on your current medications as directed. Please refer to the Current Medication list given to you today.  Labwork: NONE  Testing/Procedures: Your physician has requested that you have en exercise stress myoview in January. For further information please visit [email or web address]. Please follow instruction sheet, as given.  Follow-Up: Your physician wants you to follow-up in: January with Dr. Johnsie Cancel.    If you need a refill on your cardiac medications before your next appointment, please call your pharmacy.

## 2016-06-10 ENCOUNTER — Other Ambulatory Visit: Payer: Self-pay | Admitting: Cardiovascular Disease

## 2016-06-14 ENCOUNTER — Other Ambulatory Visit: Payer: Self-pay | Admitting: Cardiovascular Disease

## 2016-06-14 DIAGNOSIS — H25013 Cortical age-related cataract, bilateral: Secondary | ICD-10-CM | POA: Diagnosis not present

## 2016-06-14 DIAGNOSIS — H2513 Age-related nuclear cataract, bilateral: Secondary | ICD-10-CM | POA: Diagnosis not present

## 2016-06-14 DIAGNOSIS — H40013 Open angle with borderline findings, low risk, bilateral: Secondary | ICD-10-CM | POA: Diagnosis not present

## 2016-06-14 DIAGNOSIS — H40053 Ocular hypertension, bilateral: Secondary | ICD-10-CM | POA: Diagnosis not present

## 2016-06-27 ENCOUNTER — Telehealth (HOSPITAL_COMMUNITY): Payer: Self-pay | Admitting: *Deleted

## 2016-06-27 ENCOUNTER — Telehealth (HOSPITAL_COMMUNITY): Payer: Self-pay

## 2016-06-27 NOTE — Telephone Encounter (Signed)
Patient given detailed instructions per Myocardial Perfusion Study Information Sheet for the test on 07/02/16 at 0730. Patient notified to arrive 15 minutes early and that it is imperative to arrive on time for appointment to keep from having the test rescheduled.  If you need to cancel or reschedule your appointment, please call the office within 24 hours of your appointment. Failure to do so may result in a cancellation of your appointment, and a $50 no show fee. Patient verbalized understanding.Sarrah Fiorenza, Ranae Palms

## 2016-06-27 NOTE — Telephone Encounter (Signed)
Patient given detailed instructions per Myocardial Perfusion Study Information Sheet for the test on 07/02/16 at 0715. Patient notified to arrive 15 minutes early and that it is imperative to arrive on time for appointment to keep from having the test rescheduled.  If you need to cancel or reschedule your appointment, please call the office within 24 hours of your appointment. Failure to do so may result in a cancellation of your appointment, and a $50 no show fee. Patient verbalized understanding.Lenni Reckner, Ranae Palms

## 2016-07-02 ENCOUNTER — Ambulatory Visit (HOSPITAL_COMMUNITY): Payer: Medicare Other | Attending: Internal Medicine

## 2016-07-02 ENCOUNTER — Encounter (HOSPITAL_COMMUNITY): Payer: Medicare Other

## 2016-07-02 DIAGNOSIS — R9439 Abnormal result of other cardiovascular function study: Secondary | ICD-10-CM | POA: Diagnosis not present

## 2016-07-02 DIAGNOSIS — I2583 Coronary atherosclerosis due to lipid rich plaque: Secondary | ICD-10-CM | POA: Diagnosis not present

## 2016-07-02 DIAGNOSIS — I10 Essential (primary) hypertension: Secondary | ICD-10-CM | POA: Diagnosis not present

## 2016-07-02 DIAGNOSIS — I2510 Atherosclerotic heart disease of native coronary artery without angina pectoris: Secondary | ICD-10-CM | POA: Insufficient documentation

## 2016-07-02 DIAGNOSIS — Z87891 Personal history of nicotine dependence: Secondary | ICD-10-CM | POA: Diagnosis not present

## 2016-07-02 MED ORDER — TECHNETIUM TC 99M TETROFOSMIN IV KIT
32.1000 | PACK | Freq: Once | INTRAVENOUS | Status: AC | PRN
  Administered 2016-07-02: 32.1 via INTRAVENOUS
  Filled 2016-07-02: qty 33

## 2016-07-03 ENCOUNTER — Ambulatory Visit (HOSPITAL_COMMUNITY): Payer: Medicare Other | Attending: Cardiovascular Disease

## 2016-07-03 ENCOUNTER — Encounter (INDEPENDENT_AMBULATORY_CARE_PROVIDER_SITE_OTHER): Payer: Self-pay

## 2016-07-03 LAB — MYOCARDIAL PERFUSION IMAGING
CHL CUP NUCLEAR SRS: 3
CSEPED: 5 min
CSEPEW: 4.6 METS
CSEPHR: 105 %
CSEPPHR: 160 {beats}/min
Exercise duration (sec): 0 s
LHR: 0.34
LV dias vol: 85 mL (ref 46–106)
LVSYSVOL: 24 mL
MPHR: 151 {beats}/min
NUC STRESS TID: 0.78
Rest HR: 75 {beats}/min
SDS: 5
SSS: 8

## 2016-07-03 MED ORDER — TECHNETIUM TC 99M TETROFOSMIN IV KIT
32.6000 | PACK | Freq: Once | INTRAVENOUS | Status: AC | PRN
  Administered 2016-07-03: 32.6 via INTRAVENOUS
  Filled 2016-07-03: qty 33

## 2016-07-11 ENCOUNTER — Other Ambulatory Visit: Payer: Self-pay | Admitting: Cardiovascular Disease

## 2016-07-19 DIAGNOSIS — I10 Essential (primary) hypertension: Secondary | ICD-10-CM | POA: Diagnosis not present

## 2016-07-19 DIAGNOSIS — E559 Vitamin D deficiency, unspecified: Secondary | ICD-10-CM | POA: Diagnosis not present

## 2016-07-19 DIAGNOSIS — E7800 Pure hypercholesterolemia, unspecified: Secondary | ICD-10-CM | POA: Diagnosis not present

## 2016-07-19 DIAGNOSIS — I2510 Atherosclerotic heart disease of native coronary artery without angina pectoris: Secondary | ICD-10-CM | POA: Diagnosis not present

## 2016-07-19 DIAGNOSIS — Z1231 Encounter for screening mammogram for malignant neoplasm of breast: Secondary | ICD-10-CM | POA: Diagnosis not present

## 2016-07-22 ENCOUNTER — Encounter: Payer: Self-pay | Admitting: Cardiovascular Disease

## 2016-07-22 ENCOUNTER — Ambulatory Visit (INDEPENDENT_AMBULATORY_CARE_PROVIDER_SITE_OTHER): Payer: Medicare Other | Admitting: Cardiovascular Disease

## 2016-07-22 VITALS — BP 136/80 | HR 64 | Ht 64.0 in | Wt 206.4 lb

## 2016-07-22 DIAGNOSIS — I2510 Atherosclerotic heart disease of native coronary artery without angina pectoris: Secondary | ICD-10-CM

## 2016-07-22 DIAGNOSIS — I2583 Coronary atherosclerosis due to lipid rich plaque: Secondary | ICD-10-CM

## 2016-07-22 NOTE — Patient Instructions (Signed)

## 2016-07-22 NOTE — Progress Notes (Signed)
[name], female   DOB: 1945/08/13, 71 y.o.   MRN: [id number] 71 y.o.  f/u for CAD  Diagnosed  10/20/12 with SSCP  Cath by Dr Fletcher Anon  Final Conclusions:  1. Severe one-vessel coronary artery disease in the distal and proximal RCA. The LAD is also significantly calcified with mild to moderate nonobstructive disease. 2  2. Normal LV systolic function.  3. Successful angioplasty and drug-eluting stent placement to the proximal and distal RCA. This was overall a difficult procedure due to bifurcation medication distally and tortuous right coronary artery.   Worked  with [name] at [employer] Has quit smoking  I congratulated her on this   Myovue 07/03/16  EF 71% no ischemia low risk study  Daughter Keshonna Nassar is Ob doctor in  [place] is at [employer] [place] and knows my son    ROS: Denies fever, malais, weight loss, blurry vision, decreased visual acuity, cough, sputum, SOB, hemoptysis, pleuritic pain, palpitaitons, heartburn, abdominal pain, melena, lower extremity edema, claudication, or rash.  All other systems reviewed and negative  General: Affect appropriate Overweight white female  HEENT: normal but bad dentition  Neck supple with no adenopathy JVP normal no bruits no thyromegaly Lungs clear with no wheezing and good diaphragmatic motion Heart:  S1/S2 no murmur, no rub, gallop or click PMI normal Abdomen: benighn, BS positve, no tenderness, no AAA no bruit.  No HSM or HJR Distal pulses intact with no bruits No edema Neuro non-focal Skin warm and dry No muscular weakness   Current Outpatient Prescriptions  Medication Sig Dispense Refill  . aspirin EC 81 MG EC tablet Take 1 tablet (81 mg total) by mouth daily.    Marland Kitchen atorvastatin (LIPITOR) 80 MG tablet Take 1 tablet (80 mg total) by mouth daily at 6 PM. 30 tablet 1  . cholecalciferol (VITAMIN D) 1000 UNITS tablet Take 4,000 Units by mouth daily.    . clopidogrel (PLAVIX) 75 MG tablet TAKE ONE  TABLET BY MOUTH ONCE DAILY WITH BREAKFAST 30 tablet 6  . losartan-hydrochlorothiazide (HYZAAR) 50-12.5 MG tablet TAKE ONE TABLET BY MOUTH ONCE DAILY 90 tablet 2  . metoprolol (LOPRESSOR) 50 MG tablet TAKE ONE TABLET BY MOUTH TWICE DAILY 90 tablet 1  . naproxen sodium (ANAPROX) 220 MG tablet Take 220 mg by mouth daily as needed. For pain    . nitroGLYCERIN (NITROSTAT) 0.4 MG SL tablet Place 1 tablet (0.4 mg total) under the tongue every 5 (five) minutes as needed for chest pain (up to 3 times). 30 tablet 0   No current facility-administered medications for this visit.     Allergies  Codeine and Other  Electrocardiogram:   10/28/13  SR rate 60 normal ECG  No change from 10/21/12   11/07/14  SR rate 59  Normal  02/06/16  SB rate 49  Normal    Assessment and Plan HTN:  Improved with Hyzaar  CAD:  RCA Stent mid and distal RCA 2014 Stable with no angina and good activity level.  Continue medical Rx   Non ischemic myovue 07/03/16   Chol:  Back on statin labs in 6 months with Dr Kenton Kingfisher  Lab Results  Component Value Date   LDLCALC 58 06/28/2013     Jenkins Rouge

## 2016-07-25 ENCOUNTER — Other Ambulatory Visit: Payer: Self-pay | Admitting: Family Medicine

## 2016-07-25 DIAGNOSIS — Z1231 Encounter for screening mammogram for malignant neoplasm of breast: Secondary | ICD-10-CM

## 2016-09-05 ENCOUNTER — Ambulatory Visit
Admission: RE | Admit: 2016-09-05 | Discharge: 2016-09-05 | Disposition: A | Discharge status: Home / Self Care | Payer: Medicare Other | Source: Ambulatory Visit | Attending: Family Medicine | Admitting: Family Medicine

## 2016-09-05 DIAGNOSIS — Z1231 Encounter for screening mammogram for malignant neoplasm of breast: Secondary | ICD-10-CM | POA: Diagnosis not present

## 2016-09-13 DIAGNOSIS — H1013 Acute atopic conjunctivitis, bilateral: Secondary | ICD-10-CM | POA: Diagnosis not present

## 2016-09-13 DIAGNOSIS — H40013 Open angle with borderline findings, low risk, bilateral: Secondary | ICD-10-CM | POA: Diagnosis not present

## 2016-09-13 DIAGNOSIS — H40053 Ocular hypertension, bilateral: Secondary | ICD-10-CM | POA: Diagnosis not present

## 2016-10-09 ENCOUNTER — Other Ambulatory Visit: Payer: Self-pay | Admitting: Cardiovascular Disease

## 2017-01-06 ENCOUNTER — Encounter: Payer: Self-pay | Admitting: *Deleted

## 2017-01-12 ENCOUNTER — Other Ambulatory Visit: Payer: Self-pay | Admitting: Cardiovascular Disease

## 2017-01-15 DIAGNOSIS — H40053 Ocular hypertension, bilateral: Secondary | ICD-10-CM | POA: Diagnosis not present

## 2017-01-15 DIAGNOSIS — H40013 Open angle with borderline findings, low risk, bilateral: Secondary | ICD-10-CM | POA: Diagnosis not present

## 2017-01-19 NOTE — Progress Notes (Signed)
[name], female   DOB: 14-Nov-1945, 71 y.o.   MRN: [id number] 71 y.o.  f/u for CAD  Diagnosed  10/20/12 with SSCP  Cath by Dr Fletcher Anon   Final Conclusions:  1. Severe one-vessel coronary artery disease in the distal and proximal RCA. The LAD is also significantly calcified with mild to moderate nonobstructive disease. 2  2. Normal LV systolic function.  3. Successful angioplasty and drug-eluting stent placement to the proximal and distal RCA. This was overall a difficult procedure due to bifurcation medication distally and tortuous right coronary artery.   Worked  with Cassandra Castillo at [employer] Has quit smoking  I congratulated her on this   Myovue 07/03/16  EF 71% no ischemia low risk study  [name] is Ob doctor in  [place] is at [employer]  and knows my son    ROS: Denies fever, malais, weight loss, blurry vision, decreased visual acuity, cough, sputum, SOB, hemoptysis, pleuritic pain, palpitaitons, heartburn, abdominal pain, melena, lower extremity edema, claudication, or rash.  All other systems reviewed and negative  General: Ht 5\' 4"  (1.626 m)   Wt 185 lb 1.9 oz (84 kg)   BMI 31.78 kg/m  Affect appropriate Obese white female  HEENT: poor dentition  Neck supple with no adenopathy JVP normal no bruits no thyromegaly Lungs clear with no wheezing and good diaphragmatic motion Heart:  S1/S2 no murmur, no rub, gallop or click PMI normal Abdomen: benighn, BS positve, no tenderness, no AAA no bruit.  No HSM or HJR Distal pulses intact with no bruits No edema Neuro non-focal Skin warm and dry No muscular weakness   Current Outpatient Prescriptions  Medication Sig Dispense Refill  . aspirin EC 81 MG EC tablet Take 1 tablet (81 mg total) by mouth daily.    Marland Kitchen atorvastatin (LIPITOR) 80 MG tablet Take 1 tablet (80 mg total) by mouth daily at 6 PM. 30 tablet 1  . cholecalciferol (VITAMIN D) 1000 UNITS tablet Take 4,000  Units by mouth daily.    . clopidogrel (PLAVIX) 75 MG tablet TAKE ONE TABLET BY MOUTH ONCE DAILY WITH BREAKFAST 90 tablet 1  . losartan-hydrochlorothiazide (HYZAAR) 50-12.5 MG tablet TAKE ONE TABLET BY MOUTH ONCE DAILY 90 tablet 2  . metoprolol (LOPRESSOR) 50 MG tablet TAKE ONE TABLET BY MOUTH TWICE DAILY 180 tablet 2  . naproxen sodium (ANAPROX) 220 MG tablet Take 220 mg by mouth daily as needed. For pain    . nitroGLYCERIN (NITROSTAT) 0.4 MG SL tablet Place 1 tablet (0.4 mg total) under the tongue every 5 (five) minutes as needed for chest pain (up to 3 times). 30 tablet 0   No current facility-administered medications for this visit.     Allergies  Codeine and Other  Electrocardiogram:   10/28/13  SR rate 60 normal ECG  No change from 10/21/12   11/07/14  SR rate 59  Normal  02/06/16  SB rate 49  Normal  01/20/17  SR rate 56 normal    Assessment and Plan HTN:  Improved with Hyzaar  CAD:  RCA Stent mid and distal RCA 2014 Stable with no angina and good activity level.  Continue medical Rx   Non ischemic myovue 07/03/16   Chol:  Back on statin labs in 6 months with Dr Cassandra Castillo  Lab Results  Component Value Date   LDLCALC 58 06/28/2013     Jenkins Rouge

## 2017-01-20 ENCOUNTER — Ambulatory Visit (INDEPENDENT_AMBULATORY_CARE_PROVIDER_SITE_OTHER): Payer: Medicare Other | Admitting: Cardiovascular Disease

## 2017-01-20 ENCOUNTER — Encounter (INDEPENDENT_AMBULATORY_CARE_PROVIDER_SITE_OTHER): Payer: Self-pay

## 2017-01-20 ENCOUNTER — Encounter: Payer: Self-pay | Admitting: Cardiovascular Disease

## 2017-01-20 VITALS — Ht 64.0 in | Wt 185.1 lb

## 2017-01-20 DIAGNOSIS — I2510 Atherosclerotic heart disease of native coronary artery without angina pectoris: Secondary | ICD-10-CM

## 2017-01-20 DIAGNOSIS — Z1389 Encounter for screening for other disorder: Secondary | ICD-10-CM | POA: Diagnosis not present

## 2017-01-20 DIAGNOSIS — E669 Obesity, unspecified: Secondary | ICD-10-CM | POA: Diagnosis not present

## 2017-01-20 DIAGNOSIS — E559 Vitamin D deficiency, unspecified: Secondary | ICD-10-CM | POA: Diagnosis not present

## 2017-01-20 DIAGNOSIS — E7800 Pure hypercholesterolemia, unspecified: Secondary | ICD-10-CM | POA: Diagnosis not present

## 2017-01-20 DIAGNOSIS — I2583 Coronary atherosclerosis due to lipid rich plaque: Secondary | ICD-10-CM

## 2017-01-20 DIAGNOSIS — Z1211 Encounter for screening for malignant neoplasm of colon: Secondary | ICD-10-CM | POA: Diagnosis not present

## 2017-01-20 DIAGNOSIS — I10 Essential (primary) hypertension: Secondary | ICD-10-CM | POA: Diagnosis not present

## 2017-01-20 NOTE — Patient Instructions (Signed)

## 2017-01-21 NOTE — Addendum Note (Signed)
Addended by: Mendel Ryder on: 01/21/2017 03:29 PM   Modules accepted: Orders

## 2017-01-21 NOTE — Addendum Note (Signed)
Addended by: Mendel Ryder on: 01/21/2017 03:27 PM   Modules accepted: Orders

## 2017-02-13 DIAGNOSIS — Z1211 Encounter for screening for malignant neoplasm of colon: Secondary | ICD-10-CM | POA: Diagnosis not present

## 2017-03-06 ENCOUNTER — Other Ambulatory Visit: Payer: Self-pay | Admitting: Cardiovascular Disease

## 2017-07-02 ENCOUNTER — Other Ambulatory Visit: Payer: Self-pay | Admitting: Cardiovascular Disease

## 2017-07-09 DIAGNOSIS — E2839 Other primary ovarian failure: Secondary | ICD-10-CM | POA: Diagnosis not present

## 2017-07-09 DIAGNOSIS — Z0000 Encounter for general adult medical examination without abnormal findings: Secondary | ICD-10-CM | POA: Diagnosis not present

## 2017-07-09 DIAGNOSIS — E559 Vitamin D deficiency, unspecified: Secondary | ICD-10-CM | POA: Diagnosis not present

## 2017-07-09 DIAGNOSIS — I10 Essential (primary) hypertension: Secondary | ICD-10-CM | POA: Diagnosis not present

## 2017-07-09 DIAGNOSIS — I2510 Atherosclerotic heart disease of native coronary artery without angina pectoris: Secondary | ICD-10-CM | POA: Diagnosis not present

## 2017-07-09 DIAGNOSIS — E7800 Pure hypercholesterolemia, unspecified: Secondary | ICD-10-CM | POA: Diagnosis not present

## 2017-07-10 ENCOUNTER — Other Ambulatory Visit: Payer: Self-pay | Admitting: Cardiovascular Disease

## 2017-07-11 DIAGNOSIS — H3509 Other intraretinal microvascular abnormalities: Secondary | ICD-10-CM | POA: Diagnosis not present

## 2017-07-11 DIAGNOSIS — H25013 Cortical age-related cataract, bilateral: Secondary | ICD-10-CM | POA: Diagnosis not present

## 2017-07-11 DIAGNOSIS — H40013 Open angle with borderline findings, low risk, bilateral: Secondary | ICD-10-CM | POA: Diagnosis not present

## 2017-07-11 DIAGNOSIS — H2513 Age-related nuclear cataract, bilateral: Secondary | ICD-10-CM | POA: Diagnosis not present

## 2017-08-04 NOTE — Progress Notes (Signed)
[name], female   DOB: 04/04/1946, 72 y.o.   MRN: [id number] 72 y.o.  f/u for CAD  Diagnosed  10/20/12 with SSCP  Cath by Dr Fletcher Anon   Final Conclusions:  1. Severe one-vessel coronary artery disease in the distal and proximal RCA. The LAD is also significantly calcified with mild to moderate nonobstructive disease. 2  2. Normal LV systolic function.  3. Successful angioplasty and drug-eluting stent placement to the proximal and distal RCA. This was overall a difficult procedure due to bifurcation medication distally and tortuous right coronary artery.   Worked  with Cassandra Castillo at [employer] Has quit smoking  I congratulated her on this   Myovue 07/03/16  EF 71% no ischemia low risk study  [name] is Ob doctor in  [place] is at [employer] playing varsity football  and knows my son  Has atypical pain in chest once every 1-2 months not exertional lasts a few minutes and goes away spontaneously Needs new nitro called in   ROS: Denies fever, malais, weight loss, blurry vision, decreased visual acuity, cough, sputum, SOB, hemoptysis, pleuritic pain, palpitaitons, heartburn, abdominal pain, melena, lower extremity edema, claudication, or rash.  All other systems reviewed and negative  General: BP 130/80   Pulse 72   Ht 5\' 4"  (1.626 m)   Wt 186 lb 4 oz (84.5 kg)   BMI 31.97 kg/m  Affect appropriate Overweight white female  HEENT: poor dentition  Neck supple with no adenopathy JVP normal no bruits no thyromegaly Lungs clear with no wheezing and good diaphragmatic motion Heart:  S1/S2 no murmur, no rub, gallop or click PMI normal Abdomen: benighn, BS positve, no tenderness, no AAA no bruit.  No HSM or HJR Distal pulses intact with no bruits No edema Neuro non-focal Skin warm and dry No muscular weakness    Current Outpatient Medications  Medication Sig Dispense Refill  . aspirin EC 81 MG EC tablet Take 1 tablet (81 mg total) by mouth daily.     Marland Kitchen atorvastatin (LIPITOR) 80 MG tablet Take 1 tablet (80 mg total) by mouth daily at 6 PM. 30 tablet 1  . cholecalciferol (VITAMIN D) 1000 UNITS tablet Take 4,000 Units by mouth daily.    . clopidogrel (PLAVIX) 75 MG tablet TAKE 1 TABLET BY MOUTH ONCE DAILY WITH BREAKFAST 90 tablet 1  . losartan-hydrochlorothiazide (HYZAAR) 50-12.5 MG tablet TAKE ONE TABLET BY MOUTH ONCE DAILY 90 tablet 3  . Magnesium 400 MG TABS Take 1 tablet by mouth daily.    . metoprolol tartrate (LOPRESSOR) 50 MG tablet Take 1 tablet (50 mg total) by mouth 2 (two) times daily. Please make yearly appt with Dr. Johnsie Cancel for July. 1st attempt 180 tablet 1  . naproxen sodium (ANAPROX) 220 MG tablet Take 220 mg by mouth daily as needed. For pain    . nitroGLYCERIN (NITROSTAT) 0.4 MG SL tablet Place 1 tablet (0.4 mg total) under the tongue every 5 (five) minutes as needed for chest pain (up to 3 times). 25 tablet 2   No current facility-administered medications for this visit.     Allergies  Codeine and Other  Electrocardiogram:   10/28/13  SR rate 60 normal ECG  No change from 10/21/12   11/07/14  SR rate 59  Normal  02/06/16  SB rate 49  Normal  01/20/17  SR rate 56 normal    Assessment and Plan HTN:  Better continue ARB   CAD:  Non  ischemic myovue 07/03/16  RCA Stent mid and distal RCA 2014 Good activity level.  Continue medical Rx  Rare atypical chest pain new nitro called in    Chol:  Back on statin labs in 6 months with Dr Terrence Dupont

## 2017-08-08 ENCOUNTER — Ambulatory Visit (INDEPENDENT_AMBULATORY_CARE_PROVIDER_SITE_OTHER): Payer: Medicare Other | Admitting: Cardiovascular Disease

## 2017-08-08 ENCOUNTER — Encounter: Payer: Self-pay | Admitting: Cardiovascular Disease

## 2017-08-08 VITALS — BP 130/80 | HR 72 | Ht 64.0 in | Wt 186.2 lb

## 2017-08-08 DIAGNOSIS — I2510 Atherosclerotic heart disease of native coronary artery without angina pectoris: Secondary | ICD-10-CM | POA: Diagnosis not present

## 2017-08-08 MED ORDER — NITROGLYCERIN 0.4 MG SL SUBL: 0.4000 mg | SUBLINGUAL_TABLET | SUBLINGUAL | 2 refills | Status: DC | PRN

## 2017-08-08 NOTE — Patient Instructions (Signed)

## 2017-08-19 ENCOUNTER — Other Ambulatory Visit: Payer: Self-pay | Admitting: Family Medicine

## 2017-08-19 DIAGNOSIS — Z1231 Encounter for screening mammogram for malignant neoplasm of breast: Secondary | ICD-10-CM

## 2017-09-02 DIAGNOSIS — E2839 Other primary ovarian failure: Secondary | ICD-10-CM | POA: Diagnosis not present

## 2017-09-09 ENCOUNTER — Ambulatory Visit
Admission: RE | Admit: 2017-09-09 | Discharge: 2017-09-09 | Disposition: A | Discharge status: Home / Self Care | Payer: Medicare Other | Source: Ambulatory Visit | Attending: Family Medicine | Admitting: Family Medicine

## 2017-09-09 DIAGNOSIS — Z1231 Encounter for screening mammogram for malignant neoplasm of breast: Secondary | ICD-10-CM | POA: Diagnosis not present

## 2018-01-01 ENCOUNTER — Other Ambulatory Visit: Payer: Self-pay | Admitting: Cardiovascular Disease

## 2018-01-14 DIAGNOSIS — I2510 Atherosclerotic heart disease of native coronary artery without angina pectoris: Secondary | ICD-10-CM | POA: Diagnosis not present

## 2018-01-14 DIAGNOSIS — E559 Vitamin D deficiency, unspecified: Secondary | ICD-10-CM | POA: Diagnosis not present

## 2018-01-14 DIAGNOSIS — E7800 Pure hypercholesterolemia, unspecified: Secondary | ICD-10-CM | POA: Diagnosis not present

## 2018-01-14 DIAGNOSIS — Z1211 Encounter for screening for malignant neoplasm of colon: Secondary | ICD-10-CM | POA: Diagnosis not present

## 2018-01-14 DIAGNOSIS — I10 Essential (primary) hypertension: Secondary | ICD-10-CM | POA: Diagnosis not present

## 2018-02-03 DIAGNOSIS — I10 Essential (primary) hypertension: Secondary | ICD-10-CM | POA: Diagnosis not present

## 2018-02-03 DIAGNOSIS — R7309 Other abnormal glucose: Secondary | ICD-10-CM | POA: Diagnosis not present

## 2018-03-03 ENCOUNTER — Other Ambulatory Visit: Payer: Self-pay | Admitting: Cardiovascular Disease

## 2018-03-03 ENCOUNTER — Telehealth: Payer: Self-pay

## 2018-03-03 MED ORDER — LOSARTAN POTASSIUM-HCTZ 50-12.5 MG PO TABS: 1.0000 | ORAL_TABLET | Freq: Every day | ORAL | 0 refills | Status: DC

## 2018-03-03 MED ORDER — LOSARTAN POTASSIUM-HCTZ 50-12.5 MG PO TABS: 1.0000 | ORAL_TABLET | Freq: Every day | ORAL | 1 refills | Status: DC

## 2018-03-03 NOTE — Telephone Encounter (Signed)
Called patient back. Asked patient for a pharmacy that we could use to try to get her medication filled. Patient stated [facility] try [facility] on [address]. Informed patient that the Losartan-HCTZ might have to be separated and ordered separately if all the pharmacy's are out of stock. Patient will call back if medication is not available.

## 2018-03-03 NOTE — Telephone Encounter (Signed)
Left message for patient to call back.  Received fax from [facility] stating that Hyzaar 50-12.5 is currently out and on back order. Will see if patient has another pharmacy they would like to use.

## 2018-03-03 NOTE — Telephone Encounter (Signed)
Follow up  ° ° °Patient is returning your call. °

## 2018-03-13 ENCOUNTER — Telehealth: Payer: Self-pay | Admitting: Cardiovascular Disease

## 2018-03-13 MED ORDER — HYDROCHLOROTHIAZIDE 12.5 MG PO CAPS: 12.5000 mg | ORAL_CAPSULE | Freq: Every day | ORAL | 3 refills | Status: DC

## 2018-03-13 MED ORDER — LOSARTAN POTASSIUM 50 MG PO TABS: 50.0000 mg | ORAL_TABLET | Freq: Every day | ORAL | 3 refills | Status: DC

## 2018-03-13 NOTE — Telephone Encounter (Signed)
[facility] is stating that pt's medication Hyzaar 50-12.5 mg tablet is on backorder and would like to know if Dr. Johnsie Cancel could split these medication in two separate medications. Please address

## 2018-03-13 NOTE — Telephone Encounter (Signed)
Ok to split medicine

## 2018-03-13 NOTE — Telephone Encounter (Signed)
Sent in Losartan 50 mg and HCTZ 12.5 mg to patient's pharmacy.

## 2018-04-17 NOTE — Progress Notes (Signed)
[name], female   DOB: 1946-03-06, 72 y.o.   MRN: [id number]     72 y.o.  f/u for CAD  Diagnosed  10/20/12 with SSCP  Cath by Dr Fletcher Anon   Final Conclusions:  1. Severe one-vessel coronary artery disease in the distal and proximal RCA. The LAD is also significantly calcified with mild to moderate nonobstructive disease. 2  2. Normal LV systolic function.  3. Successful angioplasty and drug-eluting stent placement to the proximal and distal RCA. This was overall a difficult procedure due to bifurcation medication distally and tortuous right coronary artery.   Worked  with Cassandra Castillo at [employer] Has quit smoking  I congratulated her on this   Myovue 07/03/16  EF 71% no ischemia low risk study  [name] is Ob doctor in  [place] is at [employer] playing varsity football  and knows my son  Did Breast Cancer 5K on her birthday 3 minutes faster than year before !!  ROS: Denies fever, malais, weight loss, blurry vision, decreased visual acuity, cough, sputum, SOB, hemoptysis, pleuritic pain, palpitaitons, heartburn, abdominal pain, melena, lower extremity edema, claudication, or rash.  All other systems reviewed and negative  General: BP 134/78   Pulse 67   Ht 5\' 4"  (1.626 m)   Wt 188 lb 12 oz (85.6 kg)   SpO2 97%   BMI 32.40 kg/m  Affect appropriate Overweight white female  HEENT: poor dentition  Neck supple with no adenopathy JVP normal no bruits no thyromegaly Lungs clear with no wheezing and good diaphragmatic motion Heart:  S1/S2 no murmur, no rub, gallop or click PMI normal Abdomen: benighn, BS positve, no tenderness, no AAA no bruit.  No HSM or HJR Distal pulses intact with no bruits No edema Neuro non-focal Skin warm and dry No muscular weakness    Current Outpatient Medications  Medication Sig Dispense Refill  . aspirin EC 81 MG EC tablet Take 1 tablet (81 mg total) by mouth daily.    Marland Kitchen atorvastatin (LIPITOR) 80 MG tablet  Take 1 tablet (80 mg total) by mouth daily at 6 PM. 30 tablet 1  . cholecalciferol (VITAMIN D) 1000 UNITS tablet Take 4,000 Units by mouth daily.    . clopidogrel (PLAVIX) 75 MG tablet TAKE 1 TABLET BY MOUTH ONCE DAILY WITH BREAKFAST 90 tablet 2  . hydrochlorothiazide (MICROZIDE) 12.5 MG capsule Take 1 capsule (12.5 mg total) by mouth daily. 90 capsule 3  . losartan (COZAAR) 50 MG tablet Take 1 tablet (50 mg total) by mouth daily. 90 tablet 3  . Magnesium 400 MG TABS Take 1 tablet by mouth daily.    . metoprolol tartrate (LOPRESSOR) 50 MG tablet Take 1 tablet (50 mg total) by mouth 2 (two) times daily. 180 tablet 2  . naproxen sodium (ANAPROX) 220 MG tablet Take 220 mg by mouth daily as needed. For pain    . nitroGLYCERIN (NITROSTAT) 0.4 MG SL tablet Place 1 tablet (0.4 mg total) under the tongue every 5 (five) minutes as needed for chest pain (up to 3 times). 25 tablet 2   No current facility-administered medications for this visit.     Allergies  Codeine and Other  Electrocardiogram:   04/22/18 SR rate 59 low voltage normal    Assessment and Plan  HTN:  Better continue ARB   CAD:  Non ischemic myovue 07/03/16  RCA Stent mid and distal RCA 2014 Good activity level.  Continue medical Rx  Rare atypical  chest pain new nitro called in    Chol:  Back on statin labs in 6 months with Dr Cassandra Castillo

## 2018-04-22 ENCOUNTER — Encounter

## 2018-04-22 ENCOUNTER — Ambulatory Visit (INDEPENDENT_AMBULATORY_CARE_PROVIDER_SITE_OTHER): Payer: Medicare Other | Admitting: Cardiovascular Disease

## 2018-04-22 ENCOUNTER — Encounter: Payer: Self-pay | Admitting: Cardiovascular Disease

## 2018-04-22 VITALS — BP 134/78 | HR 67 | Ht 64.0 in | Wt 188.8 lb

## 2018-04-22 DIAGNOSIS — E785 Hyperlipidemia, unspecified: Secondary | ICD-10-CM | POA: Diagnosis not present

## 2018-04-22 DIAGNOSIS — I2510 Atherosclerotic heart disease of native coronary artery without angina pectoris: Secondary | ICD-10-CM

## 2018-04-22 DIAGNOSIS — I10 Essential (primary) hypertension: Secondary | ICD-10-CM

## 2018-04-22 NOTE — Patient Instructions (Signed)

## 2018-05-26 ENCOUNTER — Other Ambulatory Visit: Payer: Self-pay | Admitting: Cardiovascular Disease

## 2018-06-04 ENCOUNTER — Other Ambulatory Visit: Payer: Self-pay | Admitting: Cardiovascular Disease

## 2018-06-05 ENCOUNTER — Other Ambulatory Visit: Payer: Self-pay

## 2018-06-05 MED ORDER — LOSARTAN POTASSIUM 50 MG PO TABS: 50.0000 mg | ORAL_TABLET | Freq: Every day | ORAL | 3 refills | Status: DC

## 2018-06-05 MED ORDER — HYDROCHLOROTHIAZIDE 12.5 MG PO CAPS: 12.5000 mg | ORAL_CAPSULE | Freq: Every day | ORAL | 3 refills | Status: DC

## 2018-07-17 DIAGNOSIS — H2513 Age-related nuclear cataract, bilateral: Secondary | ICD-10-CM | POA: Diagnosis not present

## 2018-07-17 DIAGNOSIS — H3509 Other intraretinal microvascular abnormalities: Secondary | ICD-10-CM | POA: Diagnosis not present

## 2018-07-17 DIAGNOSIS — H25013 Cortical age-related cataract, bilateral: Secondary | ICD-10-CM | POA: Diagnosis not present

## 2018-07-17 DIAGNOSIS — H40013 Open angle with borderline findings, low risk, bilateral: Secondary | ICD-10-CM | POA: Diagnosis not present

## 2018-08-19 DIAGNOSIS — E669 Obesity, unspecified: Secondary | ICD-10-CM | POA: Diagnosis not present

## 2018-08-19 DIAGNOSIS — E559 Vitamin D deficiency, unspecified: Secondary | ICD-10-CM | POA: Diagnosis not present

## 2018-08-19 DIAGNOSIS — R739 Hyperglycemia, unspecified: Secondary | ICD-10-CM | POA: Diagnosis not present

## 2018-08-19 DIAGNOSIS — I2510 Atherosclerotic heart disease of native coronary artery without angina pectoris: Secondary | ICD-10-CM | POA: Diagnosis not present

## 2018-08-19 DIAGNOSIS — E7800 Pure hypercholesterolemia, unspecified: Secondary | ICD-10-CM | POA: Diagnosis not present

## 2018-08-19 DIAGNOSIS — I10 Essential (primary) hypertension: Secondary | ICD-10-CM | POA: Diagnosis not present

## 2018-08-19 DIAGNOSIS — Z6833 Body mass index (BMI) 33.0-33.9, adult: Secondary | ICD-10-CM | POA: Diagnosis not present

## 2018-08-19 DIAGNOSIS — Z0000 Encounter for general adult medical examination without abnormal findings: Secondary | ICD-10-CM | POA: Diagnosis not present

## 2018-08-19 DIAGNOSIS — L989 Disorder of the skin and subcutaneous tissue, unspecified: Secondary | ICD-10-CM | POA: Diagnosis not present

## 2018-09-29 DIAGNOSIS — D225 Melanocytic nevi of trunk: Secondary | ICD-10-CM | POA: Diagnosis not present

## 2018-09-29 DIAGNOSIS — Z808 Family history of malignant neoplasm of other organs or systems: Secondary | ICD-10-CM | POA: Diagnosis not present

## 2018-09-29 DIAGNOSIS — D1801 Hemangioma of skin and subcutaneous tissue: Secondary | ICD-10-CM | POA: Diagnosis not present

## 2018-09-29 DIAGNOSIS — L570 Actinic keratosis: Secondary | ICD-10-CM | POA: Diagnosis not present

## 2018-09-29 DIAGNOSIS — L821 Other seborrheic keratosis: Secondary | ICD-10-CM | POA: Diagnosis not present

## 2018-10-01 ENCOUNTER — Other Ambulatory Visit: Payer: Self-pay | Admitting: Cardiovascular Disease

## 2019-03-03 DIAGNOSIS — I10 Essential (primary) hypertension: Secondary | ICD-10-CM | POA: Diagnosis not present

## 2019-03-03 DIAGNOSIS — I2510 Atherosclerotic heart disease of native coronary artery without angina pectoris: Secondary | ICD-10-CM | POA: Diagnosis not present

## 2019-03-03 DIAGNOSIS — Z1211 Encounter for screening for malignant neoplasm of colon: Secondary | ICD-10-CM | POA: Diagnosis not present

## 2019-03-03 DIAGNOSIS — E559 Vitamin D deficiency, unspecified: Secondary | ICD-10-CM | POA: Diagnosis not present

## 2019-03-03 DIAGNOSIS — E7800 Pure hypercholesterolemia, unspecified: Secondary | ICD-10-CM | POA: Diagnosis not present

## 2019-04-23 DIAGNOSIS — E7800 Pure hypercholesterolemia, unspecified: Secondary | ICD-10-CM | POA: Diagnosis not present

## 2019-04-23 DIAGNOSIS — E559 Vitamin D deficiency, unspecified: Secondary | ICD-10-CM | POA: Diagnosis not present

## 2019-04-23 DIAGNOSIS — I10 Essential (primary) hypertension: Secondary | ICD-10-CM | POA: Diagnosis not present

## 2019-05-02 NOTE — Progress Notes (Signed)
[name], female   DOB: 07/29/1945, 73 y.o.   MRN: [id number]     73 y.o.  f/u for CAD  Diagnosed  10/20/12 with SSCP  Cath by Dr Cassandra Castillo   Final Conclusions:  1. Severe one-vessel coronary artery disease in the distal and proximal RCA. The LAD is also significantly calcified with mild to moderate nonobstructive disease. 2  2. Normal LV systolic function.  3. Successful angioplasty and drug-eluting stent placement to the proximal and distal RCA. This was overall a difficult procedure due to bifurcation medication distally and tortuous right coronary artery.   Worked  with Cassandra Castillo at [employer] Has quit smoking  I congratulated her on this   Myovue 07/03/16  EF 71% no ischemia low risk study  [name] is Ob doctor in  [place] played football at [employer]   Did Breast Cancer 5K  Last year in record time   She is frustrated with COVID restrictions Mail room will be moving in January which is causing some chaos as well   ROS: Denies fever, malais, weight loss, blurry vision, decreased visual acuity, cough, sputum, SOB, hemoptysis, pleuritic pain, palpitaitons, heartburn, abdominal pain, melena, lower extremity edema, claudication, or rash.  All other systems reviewed and negative  General: BP 132/84   Pulse (!) 50   Ht 5\' 4"  (1.626 m)   Wt 201 lb (91.2 kg)   SpO2 92%   BMI 34.50 kg/m  Affect appropriate Overweight white female  HEENT: poor dentition  Neck supple with no adenopathy JVP normal no bruits no thyromegaly Lungs clear with no wheezing and good diaphragmatic motion Heart:  S1/S2 no murmur, no rub, gallop or click PMI normal Abdomen: benighn, BS positve, no tenderness, no AAA no bruit.  No HSM or HJR Distal pulses intact with no bruits No edema Neuro non-focal Skin warm and dry No muscular weakness    Current Outpatient Medications  Medication Sig Dispense Refill  . aspirin EC 81 MG EC tablet Take 1 tablet (81 mg total)  by mouth daily.    Marland Kitchen atorvastatin (LIPITOR) 80 MG tablet Take 1 tablet (80 mg total) by mouth daily at 6 PM. 30 tablet 1  . cholecalciferol (VITAMIN D) 1000 UNITS tablet Take 4,000 Units by mouth daily.    . clopidogrel (PLAVIX) 75 MG tablet Take 1 tablet by mouth once daily with breakfast 90 tablet 3  . hydrochlorothiazide (MICROZIDE) 12.5 MG capsule Take 1 capsule (12.5 mg total) by mouth daily. 90 capsule 3  . losartan (COZAAR) 50 MG tablet Take 1 tablet (50 mg total) by mouth daily. 90 tablet 3  . Magnesium 400 MG TABS Take 1 tablet by mouth daily.    . metoprolol tartrate (LOPRESSOR) 50 MG tablet Take 1 tablet by mouth twice daily 180 tablet 2  . naproxen sodium (ANAPROX) 220 MG tablet Take 220 mg by mouth daily as needed. For pain    . nitroGLYCERIN (NITROSTAT) 0.4 MG SL tablet Place 1 tablet (0.4 mg total) under the tongue every 5 (five) minutes as needed for chest pain (up to 3 times). 25 tablet 2   No current facility-administered medications for this visit.     Allergies  Codeine and Other  Electrocardiogram:   04/22/18 SR rate 59 low voltage normal  05/06/19 SR rate 50 normal   Assessment and Plan  HTN:  Better continue ARB   CAD:  Non ischemic myovue 07/03/16  RCA Stent mid and distal  RCA 2014 Good activity level.  Continue medical Rx  Rare atypical chest pain new nitro called in    Chol:  Back on statin labs  With Dr Cassandra Castillo

## 2019-05-06 ENCOUNTER — Ambulatory Visit (INDEPENDENT_AMBULATORY_CARE_PROVIDER_SITE_OTHER): Payer: Medicare Other | Admitting: Cardiovascular Disease

## 2019-05-06 ENCOUNTER — Encounter: Payer: Self-pay | Admitting: Cardiovascular Disease

## 2019-05-06 ENCOUNTER — Other Ambulatory Visit: Payer: Self-pay

## 2019-05-06 VITALS — BP 132/84 | HR 50 | Ht 64.0 in | Wt 201.0 lb

## 2019-05-06 DIAGNOSIS — E785 Hyperlipidemia, unspecified: Secondary | ICD-10-CM

## 2019-05-06 DIAGNOSIS — I2510 Atherosclerotic heart disease of native coronary artery without angina pectoris: Secondary | ICD-10-CM

## 2019-05-06 DIAGNOSIS — I10 Essential (primary) hypertension: Secondary | ICD-10-CM | POA: Diagnosis not present

## 2019-05-06 NOTE — Patient Instructions (Addendum)

## 2019-07-01 ENCOUNTER — Other Ambulatory Visit: Payer: Self-pay | Admitting: Cardiovascular Disease

## 2019-07-22 DIAGNOSIS — H40013 Open angle with borderline findings, low risk, bilateral: Secondary | ICD-10-CM | POA: Diagnosis not present

## 2019-07-22 DIAGNOSIS — H2513 Age-related nuclear cataract, bilateral: Secondary | ICD-10-CM | POA: Diagnosis not present

## 2019-07-22 DIAGNOSIS — H35363 Drusen (degenerative) of macula, bilateral: Secondary | ICD-10-CM | POA: Diagnosis not present

## 2019-07-22 DIAGNOSIS — H25013 Cortical age-related cataract, bilateral: Secondary | ICD-10-CM | POA: Diagnosis not present

## 2019-08-26 ENCOUNTER — Other Ambulatory Visit: Payer: Self-pay | Admitting: Cardiovascular Disease

## 2019-08-31 DIAGNOSIS — Z0000 Encounter for general adult medical examination without abnormal findings: Secondary | ICD-10-CM | POA: Diagnosis not present

## 2019-08-31 DIAGNOSIS — I2510 Atherosclerotic heart disease of native coronary artery without angina pectoris: Secondary | ICD-10-CM | POA: Diagnosis not present

## 2019-08-31 DIAGNOSIS — E7800 Pure hypercholesterolemia, unspecified: Secondary | ICD-10-CM | POA: Diagnosis not present

## 2019-08-31 DIAGNOSIS — I10 Essential (primary) hypertension: Secondary | ICD-10-CM | POA: Diagnosis not present

## 2019-08-31 DIAGNOSIS — E669 Obesity, unspecified: Secondary | ICD-10-CM | POA: Diagnosis not present

## 2019-08-31 DIAGNOSIS — E559 Vitamin D deficiency, unspecified: Secondary | ICD-10-CM | POA: Diagnosis not present

## 2019-08-31 DIAGNOSIS — Z1211 Encounter for screening for malignant neoplasm of colon: Secondary | ICD-10-CM | POA: Diagnosis not present

## 2019-09-08 DIAGNOSIS — Z1211 Encounter for screening for malignant neoplasm of colon: Secondary | ICD-10-CM | POA: Diagnosis not present

## 2019-09-16 ENCOUNTER — Other Ambulatory Visit: Payer: Self-pay | Admitting: Family Medicine

## 2019-09-16 DIAGNOSIS — Z1231 Encounter for screening mammogram for malignant neoplasm of breast: Secondary | ICD-10-CM

## 2019-09-28 ENCOUNTER — Other Ambulatory Visit: Payer: Self-pay | Admitting: Cardiovascular Disease

## 2019-10-01 ENCOUNTER — Ambulatory Visit
Admission: RE | Admit: 2019-10-01 | Discharge: 2019-10-01 | Disposition: A | Discharge status: Home / Self Care | Payer: Medicare Other | Source: Ambulatory Visit | Attending: Family Medicine | Admitting: Family Medicine

## 2019-10-01 ENCOUNTER — Other Ambulatory Visit: Payer: Self-pay

## 2019-10-01 DIAGNOSIS — Z1231 Encounter for screening mammogram for malignant neoplasm of breast: Secondary | ICD-10-CM | POA: Diagnosis not present

## 2019-11-05 NOTE — Progress Notes (Signed)
[name], female   DOB: [birth year], 74 y.o.   MRN: [id number]     74 y.o.  f/u for CAD  Diagnosed  10/20/12 with SSCP  Cath by Dr Fletcher Anon   Final Conclusions:  1. Severe one-vessel coronary artery disease in the distal and proximal RCA. The LAD is also significantly calcified with mild to moderate nonobstructive disease. 2  2. Normal LV systolic function.  3. Successful angioplasty and drug-eluting stent placement to the proximal and distal RCA. This was overall a difficult procedure due to bifurcation medication distally and tortuous right coronary artery.   Worked  with [name] at [employer]  Daughter Blanka Luttman is Ob doctor in  [place] played football at [employer] I also take care of her husband   Previous smoker   Myovue 07/03/16  EF 71% no ischemia low risk study  She needs a colonoscopy with Dr Watt Climes for heme positive stool Having some atypical chest pain mostly resting at night   ROS: Denies fever, malais, weight loss, blurry vision, decreased visual acuity, cough, sputum, SOB, hemoptysis, pleuritic pain, palpitaitons, heartburn, abdominal pain, melena, lower extremity edema, claudication, or rash.  All other systems reviewed and negative  General: BP 124/78   Pulse 63   Ht 5\' 4"  (1.626 m)   Wt 205 lb (93 kg)   SpO2 97%   BMI 35.19 kg/m  Affect appropriate Overweight white female  HEENT: poor dentition  Neck supple with no adenopathy JVP normal no bruits no thyromegaly Lungs clear with no wheezing and good diaphragmatic motion Heart:  S1/S2 no murmur, no rub, gallop or click PMI normal Abdomen: benighn, BS positve, no tenderness, no AAA no bruit.  No HSM or HJR Distal pulses intact with no bruits No edema Neuro non-focal Skin warm and dry No muscular weakness    Current Outpatient Medications  Medication Sig Dispense Refill  . aspirin EC 81 MG EC tablet Take 1 tablet (81 mg total) by mouth daily.    Marland Kitchen atorvastatin (LIPITOR) 80  MG tablet Take 1 tablet (80 mg total) by mouth daily at 6 PM. 30 tablet 1  . cholecalciferol (VITAMIN D) 1000 UNITS tablet Take 4,000 Units by mouth daily.    . clopidogrel (PLAVIX) 75 MG tablet Take 1 tablet by mouth once daily with breakfast 90 tablet 2  . hydrochlorothiazide (MICROZIDE) 12.5 MG capsule Take 1 capsule (12.5 mg total) by mouth daily. 90 capsule 3  . losartan (COZAAR) 50 MG tablet Take 1 tablet (50 mg total) by mouth daily. 90 tablet 3  . losartan-hydrochlorothiazide (HYZAAR) 50-12.5 MG tablet TAKE 1 TABLET BY MOUTH EVERY DAY 90 tablet 2  . Magnesium 400 MG TABS Take 1 tablet by mouth daily.    . metoprolol tartrate (LOPRESSOR) 50 MG tablet Take 1 tablet by mouth twice daily 180 tablet 3  . naproxen sodium (ANAPROX) 220 MG tablet Take 220 mg by mouth daily as needed. For pain    . nitroGLYCERIN (NITROSTAT) 0.4 MG SL tablet Place 1 tablet (0.4 mg total) under the tongue every 5 (five) minutes as needed for chest pain (up to 3 times). 25 tablet 2   No current facility-administered medications for this visit.    Allergies  Codeine and Other  Electrocardiogram:   04/22/18 SR rate 59 low voltage normal  05/06/19 SR rate 50 normal   Assessment and Plan  HTN:  Better continue ARB   CAD:  Non ischemic myovue 07/03/16  RCA  Stent mid and distal RCA 2014 Her original angina was very atypical and mostly resting. She has had some non discript tightness in her chest again especially at night when she goes to bed Will order exercise myovue New nitro called in    Chol:  Back on statin labs  With Dr Kenton Kingfisher   GI:  Positive blood in stool seeing Dr Watt Climes GI Monday likely to arrange colonoscopy Will need to pass her stress test before stopping plavix and proceeding    Jenkins Rouge

## 2019-11-12 ENCOUNTER — Ambulatory Visit (INDEPENDENT_AMBULATORY_CARE_PROVIDER_SITE_OTHER): Payer: Medicare Other | Admitting: Cardiovascular Disease

## 2019-11-12 ENCOUNTER — Other Ambulatory Visit: Payer: Self-pay

## 2019-11-12 ENCOUNTER — Encounter: Payer: Self-pay | Admitting: Cardiovascular Disease

## 2019-11-12 VITALS — BP 124/78 | HR 63 | Ht 64.0 in | Wt 205.0 lb

## 2019-11-12 DIAGNOSIS — R079 Chest pain, unspecified: Secondary | ICD-10-CM | POA: Diagnosis not present

## 2019-11-12 DIAGNOSIS — I2510 Atherosclerotic heart disease of native coronary artery without angina pectoris: Secondary | ICD-10-CM

## 2019-11-12 MED ORDER — NITROGLYCERIN 0.4 MG SL SUBL: 0.4000 mg | SUBLINGUAL_TABLET | SUBLINGUAL | 2 refills | Status: DC | PRN

## 2019-11-12 NOTE — Patient Instructions (Signed)
Medication Instructions:  Your physician has recommended you make the following change in your medication:  Take 1 nitroglycerin (NTG), under your tongue, while sitting. If no relief of pain may repeat NTG, one tab every 5 minutes up to 3 tablets total over 15 minutes. If no relief CALL 911. If you have dizziness/lightheadness while taking NTG, stop taking and call 911.  *If you need a refill on your cardiac medications before your next appointment, please call your pharmacy*  Lab Work: If you have labs (blood work) drawn today and your tests are completely normal, you will receive your results only by: Marland Kitchen MyChart Message (if you have MyChart) OR . A paper copy in the mail If you have any lab test that is abnormal or we need to change your treatment, we will call you to review the results.  Testing/Procedures: Your physician has requested that you have en exercise stress myoview. For further information please visit [email or web address]. Please follow instruction sheet, as given.  Follow-Up: At [facility], you and your health needs are our priority.  As part of our continuing mission to provide you with exceptional heart care, we have created designated Provider Care Teams.  These Care Teams include your primary Cardiologist (physician) and Advanced Practice Providers (APPs -  Physician Assistants and Nurse Practitioners) who all work together to provide you with the care you need, when you need it.  We recommend signing up for the patient portal called "MyChart".  Sign up information is provided on this After Visit Summary.  MyChart is used to connect with patients for Virtual Visits (Telemedicine).  Patients are able to view lab/test results, encounter notes, upcoming appointments, etc.  Non-urgent messages can be sent to your provider as well.   To learn more about what you can do with MyChart, go to [email or web address].    Your next appointment:   6 month(s)  The format for  your next appointment:   In Person  Provider:   You may see Jenkins Rouge, MD or one of the following Advanced Practice Providers on your designated Care Team:    Truitt Merle, NP  Cecilie Kicks, NP  Kathyrn Drown, NP

## 2019-11-15 DIAGNOSIS — Z1211 Encounter for screening for malignant neoplasm of colon: Secondary | ICD-10-CM | POA: Diagnosis not present

## 2019-11-15 DIAGNOSIS — R195 Other fecal abnormalities: Secondary | ICD-10-CM | POA: Diagnosis not present

## 2019-11-25 ENCOUNTER — Telehealth (HOSPITAL_COMMUNITY): Payer: Self-pay

## 2019-11-25 NOTE — Telephone Encounter (Signed)
Detailed instructions left on the patient's answering. Asked to call back with any questions. Cassandra Castillo  EMTP

## 2019-11-26 ENCOUNTER — Other Ambulatory Visit (HOSPITAL_COMMUNITY)
Admission: RE | Admit: 2019-11-26 | Discharge: 2019-11-26 | Disposition: A | Discharge status: Home / Self Care | Payer: Medicare Other | Source: Ambulatory Visit | Attending: Cardiovascular Disease | Admitting: Cardiovascular Disease

## 2019-11-26 DIAGNOSIS — Z01812 Encounter for preprocedural laboratory examination: Secondary | ICD-10-CM | POA: Diagnosis not present

## 2019-11-26 DIAGNOSIS — Z20822 Contact with and (suspected) exposure to covid-19: Secondary | ICD-10-CM | POA: Insufficient documentation

## 2019-11-27 LAB — SARS CORONAVIRUS 2 (TAT 6-24 HRS): SARS Coronavirus 2: NEGATIVE

## 2019-11-30 ENCOUNTER — Ambulatory Visit (HOSPITAL_COMMUNITY): Payer: Medicare Other | Attending: Cardiology

## 2019-11-30 ENCOUNTER — Other Ambulatory Visit: Payer: Self-pay

## 2019-11-30 VITALS — Ht 64.0 in | Wt 205.0 lb

## 2019-11-30 DIAGNOSIS — R079 Chest pain, unspecified: Secondary | ICD-10-CM | POA: Insufficient documentation

## 2019-11-30 MED ORDER — TECHNETIUM TC 99M TETROFOSMIN IV KIT
33.0000 | PACK | Freq: Once | INTRAVENOUS | Status: AC | PRN
  Administered 2019-11-30: 33 via INTRAVENOUS
  Filled 2019-11-30: qty 33

## 2019-12-02 ENCOUNTER — Ambulatory Visit (HOSPITAL_COMMUNITY): Payer: Medicare Other | Attending: Cardiovascular Disease

## 2019-12-02 ENCOUNTER — Other Ambulatory Visit: Payer: Self-pay

## 2019-12-02 LAB — MYOCARDIAL PERFUSION IMAGING
Estimated workload: 3.6 METS
Exercise duration (min): 4 min
Exercise duration (sec): 31 s
LV dias vol: 65 mL (ref 46–106)
LV sys vol: 26 mL
MPHR: 148 {beats}/min
Peak HR: 164 {beats}/min
Percent HR: 164 %
Rest HR: 65 {beats}/min
SDS: 0
SRS: 0
SSS: 0
TID: 1.08

## 2019-12-02 MED ORDER — TECHNETIUM TC 99M TETROFOSMIN IV KIT
31.7000 | PACK | Freq: Once | INTRAVENOUS | Status: AC | PRN
  Administered 2019-12-02: 31.7 via INTRAVENOUS
  Filled 2019-12-02: qty 32

## 2019-12-10 DIAGNOSIS — I2510 Atherosclerotic heart disease of native coronary artery without angina pectoris: Secondary | ICD-10-CM | POA: Diagnosis not present

## 2019-12-10 DIAGNOSIS — I10 Essential (primary) hypertension: Secondary | ICD-10-CM | POA: Diagnosis not present

## 2019-12-10 DIAGNOSIS — E7800 Pure hypercholesterolemia, unspecified: Secondary | ICD-10-CM | POA: Diagnosis not present

## 2020-02-07 ENCOUNTER — Telehealth: Payer: Self-pay | Admitting: *Deleted

## 2020-02-07 NOTE — Telephone Encounter (Signed)
   Primary Cardiologist: Jenkins Rouge, MD  Chart reviewed as part of pre-operative protocol coverage. Given past medical history and time since last visit, based on ACC/AHA guidelines, Cassandra Castillo would be at acceptable risk for the planned procedure without further cardiovascular testing.   Her Plavix may be held for 5 days prior to her procedure.  Please resume as soon as hemostasis is achieved.  Please continue aspirin through procedure.  I will route this recommendation to the requesting party via Epic fax function and remove from pre-op pool.  Please call with questions.  Jossie Ng. Dorsie Burich NP-C    02/07/2020, 10:40 AM [address] Office [phone] Fax [phone]

## 2020-02-07 NOTE — Telephone Encounter (Signed)
    [facility] Pre-operative Risk Assessment    HEARTCARE STAFF: - Please ensure there is not already an duplicate clearance open for this procedure. - Under Visit Info/Reason for Call, type in Other and utilize the format Clearance MM/DD/YY or Clearance TBD. Do not use dashes or single digits. - If request is for dental extraction, please clarify the # of teeth to be extracted.  Request for surgical clearance:  1. What type of surgery is being performed? Colonoscopy   2. When is this surgery scheduled? 03/24/2020   3. What type of clearance is required (medical clearance vs. Pharmacy clearance to hold med vs. Both)? Both  4. Are there any medications that need to be held prior to surgery and how long?Plavix and Asa 65m  5. Practice name and name of physician performing surgery? [facility], Dr MWatt Climes  6. What is the office phone number? [phone]  7.   What is the office fax number? [phone] 8.   Anesthesia type (None, local, MAC, general) ? Propofol   Cassandra Castillo L 02/07/2020, 9:28 AM  _________________________________________________________________   (provider comments below)

## 2020-02-07 NOTE — Telephone Encounter (Signed)
May hold plavix

## 2020-02-07 NOTE — Telephone Encounter (Signed)
Daleah Coulson 74 year old female is requesting colonoscopy. May her Plavix and aspirin be held prior to the procedure?  She was last seen in the clinic on 11/12/2019.  During that time she described atypical chest pain resting  at night but denied palpitations, lower extremity edema, and claudication.  Her PMH includes essential hypertension, coronary artery disease nonischemic stress test 1/18, RCA stent and distal RCA stent 2014, normal Myoview no ischemia 12/02/2019, hyperlipidemia, GI bleed, and thyroid disease.  Please direct response to [facility] preop pool.  Thank you for your help.  Jossie Ng. Chelly Dombeck NP-C    02/07/2020, 10:03 AM [address] Office [phone] Fax [phone]

## 2020-03-02 DIAGNOSIS — E559 Vitamin D deficiency, unspecified: Secondary | ICD-10-CM | POA: Diagnosis not present

## 2020-03-02 DIAGNOSIS — I2510 Atherosclerotic heart disease of native coronary artery without angina pectoris: Secondary | ICD-10-CM | POA: Diagnosis not present

## 2020-03-02 DIAGNOSIS — E7800 Pure hypercholesterolemia, unspecified: Secondary | ICD-10-CM | POA: Diagnosis not present

## 2020-03-02 DIAGNOSIS — I10 Essential (primary) hypertension: Secondary | ICD-10-CM | POA: Diagnosis not present

## 2020-03-17 DIAGNOSIS — R300 Dysuria: Secondary | ICD-10-CM | POA: Diagnosis not present

## 2020-03-21 DIAGNOSIS — Z1159 Encounter for screening for other viral diseases: Secondary | ICD-10-CM | POA: Diagnosis not present

## 2020-03-24 DIAGNOSIS — D120 Benign neoplasm of cecum: Secondary | ICD-10-CM | POA: Diagnosis not present

## 2020-03-24 DIAGNOSIS — D124 Benign neoplasm of descending colon: Secondary | ICD-10-CM | POA: Diagnosis not present

## 2020-03-24 DIAGNOSIS — D125 Benign neoplasm of sigmoid colon: Secondary | ICD-10-CM | POA: Diagnosis not present

## 2020-03-24 DIAGNOSIS — D123 Benign neoplasm of transverse colon: Secondary | ICD-10-CM | POA: Diagnosis not present

## 2020-03-24 DIAGNOSIS — R195 Other fecal abnormalities: Secondary | ICD-10-CM | POA: Diagnosis not present

## 2020-03-24 DIAGNOSIS — D122 Benign neoplasm of ascending colon: Secondary | ICD-10-CM | POA: Diagnosis not present

## 2020-03-24 DIAGNOSIS — K5730 Diverticulosis of large intestine without perforation or abscess without bleeding: Secondary | ICD-10-CM | POA: Diagnosis not present

## 2020-03-25 ENCOUNTER — Encounter (HOSPITAL_COMMUNITY): Payer: Self-pay | Admitting: Emergency Medicine

## 2020-03-25 ENCOUNTER — Inpatient Hospital Stay (HOSPITAL_COMMUNITY)
Admission: EM | Admit: 2020-03-25 | Discharge: 2020-03-27 | DRG: 908 | Disposition: A | Discharge status: Home / Self Care | Payer: Medicare Other | Attending: Internal Medicine | Admitting: Internal Medicine

## 2020-03-25 ENCOUNTER — Observation Stay (HOSPITAL_COMMUNITY): Payer: Medicare Other

## 2020-03-25 ENCOUNTER — Other Ambulatory Visit: Payer: Self-pay

## 2020-03-25 DIAGNOSIS — K921 Melena: Secondary | ICD-10-CM | POA: Diagnosis present

## 2020-03-25 DIAGNOSIS — Z9049 Acquired absence of other specified parts of digestive tract: Secondary | ICD-10-CM

## 2020-03-25 DIAGNOSIS — I10 Essential (primary) hypertension: Secondary | ICD-10-CM | POA: Diagnosis present

## 2020-03-25 DIAGNOSIS — K625 Hemorrhage of anus and rectum: Secondary | ICD-10-CM | POA: Diagnosis not present

## 2020-03-25 DIAGNOSIS — R001 Bradycardia, unspecified: Secondary | ICD-10-CM | POA: Diagnosis present

## 2020-03-25 DIAGNOSIS — Z955 Presence of coronary angioplasty implant and graft: Secondary | ICD-10-CM

## 2020-03-25 DIAGNOSIS — I959 Hypotension, unspecified: Secondary | ICD-10-CM | POA: Diagnosis not present

## 2020-03-25 DIAGNOSIS — K91840 Postprocedural hemorrhage and hematoma of a digestive system organ or structure following a digestive system procedure: Secondary | ICD-10-CM | POA: Diagnosis not present

## 2020-03-25 DIAGNOSIS — Z9851 Tubal ligation status: Secondary | ICD-10-CM

## 2020-03-25 DIAGNOSIS — D62 Acute posthemorrhagic anemia: Secondary | ICD-10-CM

## 2020-03-25 DIAGNOSIS — R58 Hemorrhage, not elsewhere classified: Secondary | ICD-10-CM | POA: Diagnosis not present

## 2020-03-25 DIAGNOSIS — Z86010 Personal history of colonic polyps: Secondary | ICD-10-CM

## 2020-03-25 DIAGNOSIS — Z87891 Personal history of nicotine dependence: Secondary | ICD-10-CM

## 2020-03-25 DIAGNOSIS — Z8249 Family history of ischemic heart disease and other diseases of the circulatory system: Secondary | ICD-10-CM

## 2020-03-25 DIAGNOSIS — K5730 Diverticulosis of large intestine without perforation or abscess without bleeding: Secondary | ICD-10-CM | POA: Diagnosis not present

## 2020-03-25 DIAGNOSIS — E079 Disorder of thyroid, unspecified: Secondary | ICD-10-CM | POA: Diagnosis present

## 2020-03-25 DIAGNOSIS — I2510 Atherosclerotic heart disease of native coronary artery without angina pectoris: Secondary | ICD-10-CM | POA: Diagnosis not present

## 2020-03-25 DIAGNOSIS — Z20822 Contact with and (suspected) exposure to covid-19: Secondary | ICD-10-CM | POA: Diagnosis not present

## 2020-03-25 DIAGNOSIS — T300 Burn of unspecified body region, unspecified degree: Secondary | ICD-10-CM | POA: Diagnosis not present

## 2020-03-25 DIAGNOSIS — E785 Hyperlipidemia, unspecified: Secondary | ICD-10-CM | POA: Diagnosis present

## 2020-03-25 DIAGNOSIS — K922 Gastrointestinal hemorrhage, unspecified: Secondary | ICD-10-CM | POA: Diagnosis present

## 2020-03-25 DIAGNOSIS — R197 Diarrhea, unspecified: Secondary | ICD-10-CM | POA: Diagnosis not present

## 2020-03-25 DIAGNOSIS — K449 Diaphragmatic hernia without obstruction or gangrene: Secondary | ICD-10-CM | POA: Diagnosis not present

## 2020-03-25 DIAGNOSIS — Z7902 Long term (current) use of antithrombotics/antiplatelets: Secondary | ICD-10-CM

## 2020-03-25 DIAGNOSIS — Y838 Other surgical procedures as the cause of abnormal reaction of the patient, or of later complication, without mention of misadventure at the time of the procedure: Secondary | ICD-10-CM | POA: Diagnosis present

## 2020-03-25 DIAGNOSIS — Z7982 Long term (current) use of aspirin: Secondary | ICD-10-CM

## 2020-03-25 DIAGNOSIS — I700 Atherosclerosis of aorta: Secondary | ICD-10-CM | POA: Diagnosis not present

## 2020-03-25 HISTORY — PX: IR ANGIOGRAM VISCERAL SELECTIVE: IMG657

## 2020-03-25 HISTORY — PX: IR US GUIDE VASC ACCESS RIGHT: IMG2390

## 2020-03-25 HISTORY — PX: IR ANGIOGRAM SELECTIVE EACH ADDITIONAL VESSEL: IMG667

## 2020-03-25 HISTORY — PX: IR EMBO ART  VEN HEMORR LYMPH EXTRAV  INC GUIDE ROADMAPPING: IMG5450

## 2020-03-25 LAB — TROPONIN I (HIGH SENSITIVITY): Troponin I (High Sensitivity): 21 ng/L — ABNORMAL HIGH (ref <18)

## 2020-03-25 LAB — CBC
HCT: 38.2 % (ref 36.0–46.0)
HCT: 29.9 % — ABNORMAL LOW (ref 36.0–46.0)
Hemoglobin: 12.7 g/dL (ref 12.0–15.0)
Hemoglobin: 9.8 g/dL — ABNORMAL LOW (ref 12.0–15.0)
MCH: 30.1 pg (ref 26.0–34.0)
MCH: 29.8 pg (ref 26.0–34.0)
MCHC: 33.2 g/dL (ref 30.0–36.0)
MCHC: 32.8 g/dL (ref 30.0–36.0)
MCV: 90.5 fL (ref 80.0–100.0)
MCV: 90.9 fL (ref 80.0–100.0)
Platelets: 290 10*3/uL (ref 150–400)
Platelets: 170 10*3/uL (ref 150–400)
RBC: 4.22 MIL/uL (ref 3.87–5.11)
RBC: 3.29 MIL/uL — ABNORMAL LOW (ref 3.87–5.11)
RDW: 12.7 % (ref 11.5–15.5)
RDW: 14.2 % (ref 11.5–15.5)
WBC: 13.4 10*3/uL — ABNORMAL HIGH (ref 4.0–10.5)
WBC: 13.5 10*3/uL — ABNORMAL HIGH (ref 4.0–10.5)
nRBC: 0 % (ref 0.0–0.2)
nRBC: 0 % (ref 0.0–0.2)

## 2020-03-25 LAB — HEMOGLOBIN AND HEMATOCRIT, BLOOD
HCT: 31.9 % — ABNORMAL LOW (ref 36.0–46.0)
HCT: 25.4 % — ABNORMAL LOW (ref 36.0–46.0)
Hemoglobin: 10.7 g/dL — ABNORMAL LOW (ref 12.0–15.0)
Hemoglobin: 8.1 g/dL — ABNORMAL LOW (ref 12.0–15.0)

## 2020-03-25 LAB — COMPREHENSIVE METABOLIC PANEL
ALT: 18 U/L (ref 0–44)
AST: 28 U/L (ref 15–41)
Albumin: 3.8 g/dL (ref 3.5–5.0)
Alkaline Phosphatase: 76 U/L (ref 38–126)
Anion gap: 9 (ref 5–15)
BUN: 15 mg/dL (ref 8–23)
CO2: 26 mmol/L (ref 22–32)
Calcium: 9.3 mg/dL (ref 8.9–10.3)
Chloride: 107 mmol/L (ref 98–111)
Creatinine, Ser: 0.74 mg/dL (ref 0.44–1.00)
GFR calc Af Amer: >60 mL/min (ref >60)
GFR calc non Af Amer: >60 mL/min (ref >60)
Glucose, Bld: 134 mg/dL — ABNORMAL HIGH (ref 70–99)
Potassium: 3.8 mmol/L (ref 3.5–5.1)
Sodium: 142 mmol/L (ref 135–145)
Total Bilirubin: 0.9 mg/dL (ref 0.3–1.2)
Total Protein: 6 g/dL — ABNORMAL LOW (ref 6.5–8.1)

## 2020-03-25 LAB — RESPIRATORY PANEL BY RT PCR (FLU A&B, COVID)
Influenza A by PCR: NEGATIVE
Influenza B by PCR: NEGATIVE
SARS Coronavirus 2 by RT PCR: NEGATIVE

## 2020-03-25 LAB — ABO/RH: ABO/RH(D): O POS

## 2020-03-25 LAB — PREPARE RBC (CROSSMATCH)

## 2020-03-25 MED ORDER — IOHEXOL 350 MG/ML SOLN
100.0000 mL | Freq: Once | INTRAVENOUS | Status: AC | PRN
  Administered 2020-03-25: 100 mL via INTRAVENOUS

## 2020-03-25 MED ORDER — LIDOCAINE HCL (PF) 1 % IJ SOLN
INTRAMUSCULAR | Status: AC
  Filled 2020-03-25: qty 30

## 2020-03-25 MED ORDER — ONDANSETRON HCL 4 MG/2ML IJ SOLN
INTRAMUSCULAR | Status: AC
  Filled 2020-03-25: qty 2

## 2020-03-25 MED ORDER — SODIUM CHLORIDE 0.9 % IV BOLUS
1000.0000 mL | Freq: Once | INTRAVENOUS | Status: AC
  Administered 2020-03-25: 1000 mL via INTRAVENOUS

## 2020-03-25 MED ORDER — MIDAZOLAM HCL 2 MG/2ML IJ SOLN
INTRAMUSCULAR | Status: AC
  Filled 2020-03-25: qty 2

## 2020-03-25 MED ORDER — SODIUM CHLORIDE 0.9 % IV SOLN: Freq: Once | INTRAVENOUS | Status: AC

## 2020-03-25 MED ORDER — MIDAZOLAM HCL 2 MG/2ML IJ SOLN
INTRAMUSCULAR | Status: DC | PRN
  Administered 2020-03-25: 0.5 mg via INTRAVENOUS

## 2020-03-25 MED ORDER — SODIUM CHLORIDE 0.9 % IV SOLN: 10.0000 mL/h | Freq: Once | INTRAVENOUS | Status: DC

## 2020-03-25 MED ORDER — SODIUM CHLORIDE 0.9 % IV BOLUS
500.0000 mL | Freq: Once | INTRAVENOUS | Status: AC
  Administered 2020-03-25: 500 mL via INTRAVENOUS

## 2020-03-25 MED ORDER — FENTANYL CITRATE (PF) 100 MCG/2ML IJ SOLN
INTRAMUSCULAR | Status: DC | PRN
  Administered 2020-03-25: 25 ug via INTRAVENOUS

## 2020-03-25 MED ORDER — GELATIN ABSORBABLE 12-7 MM EX MISC
CUTANEOUS | Status: AC
  Filled 2020-03-25: qty 1

## 2020-03-25 MED ORDER — SODIUM CHLORIDE 0.9 % IV SOLN: INTRAVENOUS | Status: AC

## 2020-03-25 MED ORDER — IOHEXOL 300 MG/ML  SOLN
150.0000 mL | Freq: Once | INTRAMUSCULAR | Status: AC | PRN
  Administered 2020-03-25: 15 mL via INTRA_ARTERIAL

## 2020-03-25 MED ORDER — FENTANYL CITRATE (PF) 100 MCG/2ML IJ SOLN
INTRAMUSCULAR | Status: AC
Start: 2020-03-25 — End: 2020-03-25
  Filled 2020-03-25: qty 2

## 2020-03-25 MED ORDER — IOHEXOL 300 MG/ML  SOLN
150.0000 mL | Freq: Once | INTRAMUSCULAR | Status: AC | PRN
  Administered 2020-03-25: 50 mL via INTRA_ARTERIAL

## 2020-03-25 MED ORDER — POLYETHYLENE GLYCOL 3350 17 G PO PACK: 17.0000 g | PACK | Freq: Two times a day (BID) | ORAL | Status: DC

## 2020-03-25 NOTE — Progress Notes (Addendum)
PROGRESS NOTE    Cassandra Castillo  WLS:[id number] DOB: 02-06-1946 DOA: 03/25/2020 PCP: Shirline Frees, MD  Outpatient Specialists:   Brief Narrative:  Patient is a 74 year old female past medical history significant for CAD with stents on aspirin and Plavix, hypertension and hyperlipidemia.  Patient developed bright red blood per rectum following colonoscopy and polypectomy done earlier in the day.  Bleeding has been significant.  On presentation to the hospital, hemoglobin had dropped from 12.7 to 10.7 g/dL on same day.  Repeat hemoglobin was 8.1 g/dL.  ET of the abdomen and pelvis with and without contrast was suggestive of active intraluminal hemorrhage in the cecal area.  Moderate sigmoid diverticulosis was also reported.  GI team, Dr. Ronald Lobo, has been directing care.  Interventional radiology team was consulted and patient underwent angiography and embolization for active extravasation from right colic artery vasa recta.  Patient had another episode of rectal bleed after the embolization, with associated brief episode of bradycardia to the 40s suspected to be vasovagal.  No chest pain or shortness of breath.  No diaphoresis.  Discussed with GI and interventional radiology team.  The plan is for possible colonoscopy if through bleeding persists.  Not clear of the noted bleeding might be old blood.  Surgical team has been consulted and on standby.  ICU team was consulted to assess patient for higher level of care.  ICU team felt patient was not ideal for ICU at the moment.  So far, patient has been transfused 2 units of packed red blood cells.  Patient has been volume resuscitated intermittently.   Assessment & Plan:   Principal Problem:   GI bleed Active Problems:   HTN (hypertension)   CAD (coronary artery disease)  Acute GI bleed:  -Patient presenting with multiple episodes of rectal bleeding status post polypectomy on 03/24/2020. -Patient presented with significant rectal bleed (bright  red per rectum). -Hemoglobin has dropped from 12 to 8 g/dL. -Intermittent episodes of hypotension.   -Work-up done suffice as documented above. -Patient has undergone angiography and embolization of a bleeding vessel. -Patient had another episode of rectal bleed, with associated vasovagal episode and systolic blood pressure in the mid to upper 80s. -ICU team, surgical team consulted.  -Likely repeat colonoscopy if patient continues to bleed. -GI team is directing care. -Further management depend on hospital course. -Continue to monitor H/H.    CAD -Hold aspirin and Plavix  Hypertension -Hold antihypertensives at this time  DVT prophylaxis: SCD Code Status: Full code Family Communication:  Disposition Plan: This will depend on hospital course   Consultants:   GI  Interventional radiology.  General surgery.  Critical care team  Procedures:   Angiography and embolization of bleeding vessel.  Antimicrobials:   None   Subjective: Rectal bleed blood.  Objective: Vitals:   03/25/20 0830 03/25/20 0839 03/25/20 0845 03/25/20 0900  BP: (!) 97/49 (!) 104/55 (!) 71/45 (!) 82/52  Pulse: 61 63 60 65  Resp: 18 19 20 17   Temp:  97.7 F (36.5 C)    TempSrc:      SpO2: 100% 100% 100% 99%    Intake/Output Summary (Last 24 hours) at 03/25/2020 0916 Last data filed at 03/25/2020 8115 Gross per 24 hour  Intake 1800 ml  Output 2000 ml  Net -200 ml   There were no vitals filed for this visit.  Examination:  General exam: Not in any obvious distress.    Respiratory system: Clear to auscultation.  Cardiovascular system: S1 & S2  Gastrointestinal  system: Abdomen is obese, soft and nontender.  Organs are difficult to assess.   Central nervous system: Alert and oriented. No focal neurological deficits. Extremities: No leg edema  Data Reviewed: I have personally reviewed following labs and imaging studies  CBC: Recent Labs  Lab 03/25/20 0130 03/25/20 0354  03/25/20 0627  WBC 13.4*  --   --   HGB 12.7 10.7* 8.1*  HCT 38.2 31.9* 25.4*  MCV 90.5  --   --   PLT 290  --   --    Basic Metabolic Panel: Recent Labs  Lab 03/25/20 0130  NA 142  K 3.8  CL 107  CO2 26  GLUCOSE 134*  BUN 15  CREATININE 0.74  CALCIUM 9.3   GFR: CrCl cannot be calculated (Unknown ideal weight.). Liver Function Tests: Recent Labs  Lab 03/25/20 0130  AST 28  ALT 18  ALKPHOS 76  BILITOT 0.9  PROT 6.0*  ALBUMIN 3.8   No results for input(s): LIPASE, AMYLASE in the last 168 hours. No results for input(s): AMMONIA in the last 168 hours. Coagulation Profile: No results for input(s): INR, PROTIME in the last 168 hours. Cardiac Enzymes: No results for input(s): CKTOTAL, CKMB, CKMBINDEX, TROPONINI in the last 168 hours. BNP (last 3 results) No results for input(s): PROBNP in the last 8760 hours. HbA1C: No results for input(s): HGBA1C in the last 72 hours. CBG: No results for input(s): GLUCAP in the last 168 hours. Lipid Profile: No results for input(s): CHOL, HDL, LDLCALC, TRIG, CHOLHDL, LDLDIRECT in the last 72 hours. Thyroid Function Tests: No results for input(s): TSH, T4TOTAL, FREET4, T3FREE, THYROIDAB in the last 72 hours. Anemia Panel: No results for input(s): VITAMINB12, FOLATE, FERRITIN, TIBC, IRON, RETICCTPCT in the last 72 hours. Urine analysis: No results found for: COLORURINE, APPEARANCEUR, LABSPEC, PHURINE, GLUCOSEU, HGBUR, BILIRUBINUR, KETONESUR, PROTEINUR, UROBILINOGEN, NITRITE, LEUKOCYTESUR Sepsis Labs: @LABRCNTIP (procalcitonin:4,lacticidven:4)  ) Recent Results (from the past 240 hour(s))  Respiratory Panel by RT PCR (Flu A&B, Covid) - Nasopharyngeal Swab     Status: None   Collection Time: 03/25/20  4:04 AM   Specimen: Nasopharyngeal Swab  Result Value Ref Range Status   SARS Coronavirus 2 by RT PCR NEGATIVE NEGATIVE Final    Comment: (NOTE) SARS-CoV-2 target nucleic acids are NOT DETECTED.  The SARS-CoV-2 RNA is generally  detectable in upper respiratoy specimens during the acute phase of infection. The lowest concentration of SARS-CoV-2 viral copies this assay can detect is 131 copies/mL. A negative result does not preclude SARS-Cov-2 infection and should not be used as the sole basis for treatment or other patient management decisions. A negative result may occur with  improper specimen collection/handling, submission of specimen other than nasopharyngeal swab, presence of viral mutation(s) within the areas targeted by this assay, and inadequate number of viral copies (<131 copies/mL). A negative result must be combined with clinical observations, patient history, and epidemiological information. The expected result is Negative.  Fact Sheet for Patients:  PinkCheek.be  Fact Sheet for Healthcare Providers:  GravelBags.it  This test is no t yet approved or cleared by the [place] FDA and  has been authorized for detection and/or diagnosis of SARS-CoV-2 by FDA under an Emergency Use Authorization (EUA). This EUA will remain  in effect (meaning this test can be used) for the duration of the COVID-19 declaration under Section 564(b)(1) of the Act, 21 U.S.C. section 360bbb-3(b)(1), unless the authorization is terminated or revoked sooner.     Influenza A by PCR NEGATIVE NEGATIVE Final  Influenza B by PCR NEGATIVE NEGATIVE Final    Comment: (NOTE) The Xpert Xpress SARS-CoV-2/FLU/RSV assay is intended as an aid in  the diagnosis of influenza from Nasopharyngeal swab specimens and  should not be used as a sole basis for treatment. Nasal washings and  aspirates are unacceptable for Xpert Xpress SARS-CoV-2/FLU/RSV  testing.  Fact Sheet for Patients: PinkCheek.be  Fact Sheet for Healthcare Providers: GravelBags.it  This test is not yet approved or cleared by the [place] FDA and   has been authorized for detection and/or diagnosis of SARS-CoV-2 by  FDA under an Emergency Use Authorization (EUA). This EUA will remain  in effect (meaning this test can be used) for the duration of the  Covid-19 declaration under Section 564(b)(1) of the Act, 21  U.S.C. section 360bbb-3(b)(1), unless the authorization is  terminated or revoked. Performed at [facility], [place] [address], [place], [place] [address]          Radiology Studies: CT Angio Abd/Pel w/ and/or w/o  Result Date: 03/25/2020 CLINICAL DATA:  Bright red blood per rectum since midnight. Polypectomy at colonoscopy yesterday. EXAM: CTA ABDOMEN AND PELVIS WITHOUT AND WITH CONTRAST TECHNIQUE: Multidetector CT imaging of the abdomen and pelvis was performed using the standard protocol during bolus administration of intravenous contrast. Multiplanar reconstructed images and MIPs were obtained and reviewed to evaluate the vascular anatomy. CONTRAST:  145mL OMNIPAQUE IOHEXOL 350 MG/ML SOLN COMPARISON:  None. FINDINGS: VASCULAR Aorta: Atherosclerotic nonaneurysmal abdominal aorta with no dissection or stenosis. Celiac: Patent without evidence of aneurysm, dissection, vasculitis or significant stenosis. SMA: Patent without evidence of aneurysm, dissection, vasculitis or significant stenosis. Renals: Both renal arteries are patent without evidence of aneurysm, dissection, vasculitis, fibromuscular dysplasia or significant stenosis. IMA: Patent without evidence of aneurysm, dissection, vasculitis or significant stenosis. Inflow: Patent without evidence of aneurysm, dissection, vasculitis or significant stenosis. Proximal Outflow: Bilateral common femoral and visualized portions of the superficial and profunda femoral arteries are patent without evidence of aneurysm, dissection, vasculitis or significant stenosis. Veins: No venous abnormality. Review of the MIP images confirms the above findings. NON-VASCULAR Lower chest: No  significant pulmonary nodules or acute consolidative airspace disease. Right coronary atherosclerosis. Hepatobiliary: Normal liver size. No liver masses. Tiny granulomatous left liver calcifications. Cholecystectomy. No biliary ductal dilatation. Pancreas: Normal, with no mass or duct dilation. Spleen: Normal size. No mass. Adrenals/Urinary Tract: Normal adrenals. No hydronephrosis. Scattered subcentimeter hypodense left renal cortical lesions are too small to characterize and require no follow-up. No suspicious renal masses. Normal bladder. Stomach/Bowel: Small hiatal hernia. Otherwise normal nondistended stomach. Normal caliber small bowel with no small bowel wall thickening. Normal appendix. Moderate sigmoid diverticulosis. There is streaky intraluminal hyperdensity in the cecum increasing between the arterial and venous phase sequences suspicious for active intraluminal hemorrhage (series 6/image 71 and series 12/image 43). No large bowel wall thickening. Vascular/Lymphatic: No pathologically enlarged lymph nodes in the abdomen or pelvis. Reproductive: Grossly normal uterus.  No adnexal mass. Other: No pneumoperitoneum, ascites or focal fluid collection. Musculoskeletal: No aggressive appearing focal osseous lesions. Moderate thoracolumbar spondylosis. IMPRESSION: 1. CT angiogram findings are suggestive of active intraluminal hemorrhage in the cecum. No free air. 2. Moderate sigmoid diverticulosis. 3. Small hiatal hernia. 4. Coronary atherosclerosis. 5. Aortic Atherosclerosis (ICD10-I70.0). Critical Value/emergent results were called by telephone at the time of interpretation on 03/25/2020 at 6:59 am to provider DR. DELO, who verbally acknowledged these results. Electronically Signed   By: Ilona Sorrel M.D.   On: 03/25/2020 07:01  Scheduled Meds: . lidocaine (PF)       Continuous Infusions: . sodium chloride 125 mL/hr at 03/25/20 0657  . sodium chloride       LOS: 0 days    Time spent: 35  minutes.    Dana Allan, MD  [facility] Pager #: [phone] 7PM-7AM contact night coverage as above

## 2020-03-25 NOTE — Consult Note (Signed)
Chief Complaint: Hematochezia  Referring Physician(s): Dr. Cristina Gong  Supervising Physician: Corrie Mckusick  Patient Status: [facility] - ED  History of Present Illness: Cassandra Castillo is a 74 y.o. female presenting to the ED at [facility] for acute RBPR.   She is in ED bed 32 with her husband when I met her.   She is lucid and provides most of the history.  She had colonoscopy and polypectomy yesterday.  This was her first colonoscopy.  She tells me all was fine until about 11:30pm when she had her first bloody BM.  After that, she has had multiple BM, all with blood.  She denies any prior problem with GI bleeding.   She denies any current chest pain.   She denies DOE or angina with activity.   She had "2 heart stents" about 6 years ago, and since then has had no problems with her heart.  She reports recent cardiology appt that was unremarkable.  She has stayed on plavix since her prior stents.   CTA of the abd/pelvis identifies likely source of the hemorrhage in the cecum/ascending colon.   Hgb at 1:30am was 12.7 Hgb at 6:27am was 8.1  She has received 2L crystalloids, and is starting her second PRBC's when in the room.   No pressors now.   Her SBP has been ranging from 83-97 on cuff.  She usually takes 2 blood pressure medication, so this is likely abnormal for her.  She is not tachy.   She has been NPO for >6hrs, and has no allergy to contrast.   Past Medical History:  Diagnosis Date  . Anginal pain ([place])   . Coronary artery disease   . Hyperlipidemia   . Hypertension   . Thyroid disease    Has had to intermittently take thyroid medicine in the past  . Tobacco abuse     Past Surgical History:  Procedure Laterality Date  . BREAST EXCISIONAL BIOPSY Left 1988  . BREAST LUMPECTOMY    . CHOLECYSTECTOMY    . LEFT HEART CATHETERIZATION WITH CORONARY ANGIOGRAM N/A 10/21/2012   Procedure: LEFT HEART CATHETERIZATION WITH CORONARY ANGIOGRAM;  Surgeon: Wellington Hampshire, MD;  Location: [facility]  CATH [facility];  Service: Cardiovascular;  Laterality: N/A;  . TONSILLECTOMY    . TUBAL LIGATION      Allergies: Codeine and Other  Medications: Prior to Admission medications   Medication Sig Start Date End Date Taking? Authorizing Provider  aspirin EC 81 MG EC tablet Take 1 tablet (81 mg total) by mouth daily. 10/22/12   Othella Boyer, MD  atorvastatin (LIPITOR) 80 MG tablet Take 1 tablet (80 mg total) by mouth daily at 6 PM. 10/22/12   Sharda, Delia Chimes, MD  cholecalciferol (VITAMIN D) 1000 UNITS tablet Take 4,000 Units by mouth daily.    [provider]  clopidogrel (PLAVIX) 75 MG tablet Take 1 tablet by mouth once daily with breakfast 09/28/19   Josue Hector, MD  hydrochlorothiazide (MICROZIDE) 12.5 MG capsule Take 1 capsule (12.5 mg total) by mouth daily. 06/05/18   Josue Hector, MD  losartan (COZAAR) 50 MG tablet Take 1 tablet (50 mg total) by mouth daily. 06/05/18   Josue Hector, MD  losartan-hydrochlorothiazide (HYZAAR) 50-12.5 MG tablet TAKE 1 TABLET BY MOUTH EVERY DAY 08/26/19   Josue Hector, MD  Magnesium 400 MG TABS Take 1 tablet by mouth daily.    [provider]  metoprolol tartrate (LOPRESSOR) 50 MG tablet Take 1 tablet by mouth  twice daily 07/01/19   Josue Hector, MD  naproxen sodium (ANAPROX) 220 MG tablet Take 220 mg by mouth daily as needed. For pain    [provider]  nitroGLYCERIN (NITROSTAT) 0.4 MG SL tablet Place 1 tablet (0.4 mg total) under the tongue every 5 (five) minutes as needed for chest pain (up to 3 times). 11/12/19   Josue Hector, MD     Family History  Problem Relation Age of Onset  . Coronary artery disease Father 64       CABG x4 at age 62, repeat 2 vessel CABG at age 26. Died of congestive heart failure 10 years later  . Heart failure Father   . Hemochromatosis Mother   . COPD Mother   . Coronary artery disease Paternal Uncle   . Hyperlipidemia Brother   . Breast cancer Paternal Aunt     Social History    Socioeconomic History  . Marital status: Married    Spouse name: Not on file  . Number of children: Not on file  . Years of education: Not on file  . Highest education level: Not on file  Occupational History  . Not on file  Tobacco Use  . Smoking status: Former Smoker    Packs/day: 1.00    Years: 40.00    Pack years: 40.00    Types: Cigarettes  . Smokeless tobacco: Never Used  Substance and Sexual Activity  . Alcohol use: No  . Drug use: No  . Sexual activity: Not on file  Other Topics Concern  . Not on file  Social History Narrative  . Not on file   Social Determinants of Health   Financial Resource Strain:   . Difficulty of Paying Living Expenses: Not on file  Food Insecurity:   . Worried About Charity fundraiser in the Last Year: Not on file  . Ran Out of Food in the Last Year: Not on file  Transportation Needs:   . Lack of Transportation (Medical): Not on file  . Lack of Transportation (Non-Medical): Not on file  Physical Activity:   . Days of Exercise per Week: Not on file  . Minutes of Exercise per Session: Not on file  Stress:   . Feeling of Stress : Not on file  Social Connections:   . Frequency of Communication with Friends and Family: Not on file  . Frequency of Social Gatherings with Friends and Family: Not on file  . Attends Religious Services: Not on file  . Active Member of Clubs or Organizations: Not on file  . Attends Archivist Meetings: Not on file  . Marital Status: Not on file      Review of Systems: A 12 point ROS discussed and pertinent positives are indicated in the HPI above.  All other systems are negative.  Review of Systems  Vital Signs: BP (!) 104/55   Pulse 62   Temp 97.8 F (36.6 C)   Resp 19   SpO2 100%   Physical Exam General: 74 yo female appearing stated age.  Well-developed, well-nourished.  No distress. HEENT: Atraumatic, normocephalic.  Conjugate gaze, extra-ocular motor intact. No scleral icterus or  scleral injection. No lesions on external ears, nose, lips, or gums.  Neck: Symmetric with no goiter enlargement.  Chest/Lungs:  Symmetric chest with inspiration/expiration.  No labored breathing.  Marland Kitchen  Heart:   No JVD appreciated.  NSR.  Abdomen:  Soft, NT/ND  Genito-urinary: Deferred Neurologic: Alert & Oriented to person, place,  and time.   Normal affect and insight.  Appropriate questions.  Moving all 4 extremities with gross sensory intact.  Pulse Exam:    Palpable right CFA & left CFA. Palpable pedal pulses.      Imaging: CT Angio Abd/Pel w/ and/or w/o  Result Date: 03/25/2020 CLINICAL DATA:  Bright red blood per rectum since midnight. Polypectomy at colonoscopy yesterday. EXAM: CTA ABDOMEN AND PELVIS WITHOUT AND WITH CONTRAST TECHNIQUE: Multidetector CT imaging of the abdomen and pelvis was performed using the standard protocol during bolus administration of intravenous contrast. Multiplanar reconstructed images and MIPs were obtained and reviewed to evaluate the vascular anatomy. CONTRAST:  111mL OMNIPAQUE IOHEXOL 350 MG/ML SOLN COMPARISON:  None. FINDINGS: VASCULAR Aorta: Atherosclerotic nonaneurysmal abdominal aorta with no dissection or stenosis. Celiac: Patent without evidence of aneurysm, dissection, vasculitis or significant stenosis. SMA: Patent without evidence of aneurysm, dissection, vasculitis or significant stenosis. Renals: Both renal arteries are patent without evidence of aneurysm, dissection, vasculitis, fibromuscular dysplasia or significant stenosis. IMA: Patent without evidence of aneurysm, dissection, vasculitis or significant stenosis. Inflow: Patent without evidence of aneurysm, dissection, vasculitis or significant stenosis. Proximal Outflow: Bilateral common femoral and visualized portions of the superficial and profunda femoral arteries are patent without evidence of aneurysm, dissection, vasculitis or significant stenosis. Veins: No venous abnormality. Review of the MIP  images confirms the above findings. NON-VASCULAR Lower chest: No significant pulmonary nodules or acute consolidative airspace disease. Right coronary atherosclerosis. Hepatobiliary: Normal liver size. No liver masses. Tiny granulomatous left liver calcifications. Cholecystectomy. No biliary ductal dilatation. Pancreas: Normal, with no mass or duct dilation. Spleen: Normal size. No mass. Adrenals/Urinary Tract: Normal adrenals. No hydronephrosis. Scattered subcentimeter hypodense left renal cortical lesions are too small to characterize and require no follow-up. No suspicious renal masses. Normal bladder. Stomach/Bowel: Small hiatal hernia. Otherwise normal nondistended stomach. Normal caliber small bowel with no small bowel wall thickening. Normal appendix. Moderate sigmoid diverticulosis. There is streaky intraluminal hyperdensity in the cecum increasing between the arterial and venous phase sequences suspicious for active intraluminal hemorrhage (series 6/image 71 and series 12/image 43). No large bowel wall thickening. Vascular/Lymphatic: No pathologically enlarged lymph nodes in the abdomen or pelvis. Reproductive: Grossly normal uterus.  No adnexal mass. Other: No pneumoperitoneum, ascites or focal fluid collection. Musculoskeletal: No aggressive appearing focal osseous lesions. Moderate thoracolumbar spondylosis. IMPRESSION: 1. CT angiogram findings are suggestive of active intraluminal hemorrhage in the cecum. No free air. 2. Moderate sigmoid diverticulosis. 3. Small hiatal hernia. 4. Coronary atherosclerosis. 5. Aortic Atherosclerosis (ICD10-I70.0). Critical Value/emergent results were called by telephone at the time of interpretation on 03/25/2020 at 6:59 am to provider DR. DELO, who verbally acknowledged these results. Electronically Signed   By: Ilona Sorrel M.D.   On: 03/25/2020 07:01    Labs:  CBC: Recent Labs    03/25/20 0130 03/25/20 0354 03/25/20 0627  WBC 13.4*  --   --   HGB 12.7 10.7*  8.1*  HCT 38.2 31.9* 25.4*  PLT 290  --   --     COAGS: No results for input(s): INR, APTT in the last 8760 hours.  BMP: Recent Labs    03/25/20 0130  NA 142  K 3.8  CL 107  CO2 26  GLUCOSE 134*  BUN 15  CALCIUM 9.3  CREATININE 0.74  GFRNONAA >60  GFRAA >60    LIVER FUNCTION TESTS: Recent Labs    03/25/20 0130  BILITOT 0.9  AST 28  ALT 18  ALKPHOS 76  PROT 6.0*  ALBUMIN 3.8    TUMOR MARKERS: No results for input(s): AFPTM, CEA, CA199, CHROMGRNA in the last 8760 hours.  Assessment and Plan:  Ms Foxworth is a 74 yo female with acute lower GI hemorrhage secondary to post-polypectomy bleed.   Her CTA is positive, she is hypotensive, she has acute anemia which developed in 6 hrs, and she is undergoing resuscitative efforts.   Angiogram and possible embolization is indicated.   I have discussed angiogram and embolization with her and her husband, who are pretty good medical understanding.  (His father was CT surgery, their daughter is OB/GYN). Risks include: bleeding, infection, contrast reaction, kidney/organ injury, arterial injury/embolization, need for additional procedure/surgery, GI ischemia, hospitalization, cardiopulmonary collapse, death.   After discussion they would like to proceed.   Plan: - Urgent mesenteric angiogram and possible embolization - agree with current care   Thank you for this interesting consult.  I greatly enjoyed meeting Cassandra Castillo and look forward to participating in their care.  A copy of this report was sent to the requesting provider on this date.  Electronically Signed: Corrie Mckusick, DO 03/25/2020, 8:28 AM   I spent a total of 80 Minutes    in face to face in clinical consultation, greater than 50% of which was counseling/coordinating care for acute lower gi bleeding, possible angiogram/embolization.

## 2020-03-25 NOTE — ED Triage Notes (Signed)
Pt transported from home by EMS for rectal bleeding x 3, bright red blood. Pt did have colonoscopy yesterday with several polyps removed and burned.  Pt ambulatory on scene.

## 2020-03-25 NOTE — Progress Notes (Signed)
Patient had hypotension, pallor, and transient bradycardia in association with a large "blowout" of blood per rectum.  At this time, approximately 30 minutes following the episode, she feels better, she is alert and nonshocky skin is warm, blood pressure 89 systolic.  I have discussed patient's case with Dr. Marthenia Rolling (hospitalist), Dr. Halford Chessman of critical care, and Dr. Donne Hazel of general surgery.  It is not clear to me whether the patient has ongoing bleeding or this was a vagal reaction to the passage of a large amount of old blood.  I favor the latter because of the transient nature of the bradycardia and hypotension, plus the fact that it occurred in association with a visceral stimulus (she was trying to hold back the bowel movement, and then had a large blowout).  Moreover, Dr. Marthenia Rolling indicates that Dr. Earleen Newport said that he had good effective embolization of the lesion.  Plan:  1.  Continue aggressive IV fluids 2.  I have placed the patient back to n.p.o. and will not use MiraLAX 3.  Recheck CBC stat 4.  Depending on the patient's vital signs and blood output (if any) over the next hour, and the results of her CBC, we should have some better idea as to whether or not the patient is having ongoing bleeding.  If it appears that she is, I think the next step would be colonoscopy, unless the patient is so unstable she needs to go directly to surgery. 5.  Low threshold for further transfusion, to optimize the patient's condition in the event that we do need to do colonoscopy later today 6.  We will continue to follow closely with you  Cleotis Nipper, M.D. Pager [phone] If no answer or after 5 PM call [phone]

## 2020-03-25 NOTE — Procedures (Signed)
Interventional Radiology Procedure Note  Procedure: US guided right CFA access.  Mesenteric angiogram.  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    Complications: None  Recommendations:  - right hip straight x 4 hrs - Do not submerge for 7 days - Routine wound care - serial H&H  - Will follow  Signed,  Dulcy Fanny. Earleen Newport, DO

## 2020-03-25 NOTE — ED Notes (Signed)
Dr Cristina Gong at bedside

## 2020-03-25 NOTE — ED Notes (Signed)
The pt has nss 1091ml running wo in both arms

## 2020-03-25 NOTE — ED Notes (Signed)
While in triage pt had syncopal episode, pt diaphoretic, snoring respirations, pt transferred to stretcher, moderate bright red rectal bleeding noted. +emesis.

## 2020-03-25 NOTE — Progress Notes (Signed)
Dr. Pasty Arch help much appreciated--successful embolization of actively bleeding vessel near the cecum.  Looks well at present and is alert and coherent and in no distress, although her blood pressure is a little soft.  It is unlikely the patient will need colonoscopy as adjunctive treatment for this bleeding lesion; in my experience, once post polypectomy bleeding is arrested, it normally stays stopped.  However, we will be on standby.  In the meantime, I will allow the patient a clear liquid diet, and will treat the patient with MiraLAX to help purge blood out of the GI tract.  Cleotis Nipper, M.D. Pager [phone] If no answer or after 5 PM call [phone]

## 2020-03-25 NOTE — ED Notes (Signed)
The pt asked to get on the bedpan  She had approx 562ml of bright red blood

## 2020-03-25 NOTE — ED Notes (Signed)
The pt has put out 2200 ml of bright red blood so far  bp is dropping  Permit  For blood pt has signed

## 2020-03-25 NOTE — Consult Note (Signed)
Referring Provider:  Dr. Trish Mage, [facility] EDP Primary Care Physician:  Shirline Frees, MD Primary Gastroenterologist:  Dr. Watt Climes  Reason for Consultation: Hematochezia  HPI: Cassandra Castillo is a 74 y.o. female with apparent post polypectomy bleeding.  Yesterday morning, she had approximately 12 polyps removed, primarily by hot snare, from various portions of her colon, with the largest one being in the region of the ileocecal valve, removed piecemeal by hot snare.  The patient was also noted to have extensive diverticulosis.  The patient is on aspirin and Plavix, but the Plavix had been held for 3 days prior to the procedure.  Yesterday evening, she began to have painless large-volume hematochezia, and she called me around 1230 this morning and was directed to the [facility] emergency room where she continued to have copious amounts of blood output per rectum, on the order of 2 L, and her blood pressure began to decline, first to the 90s, then to the 70s.  Hemoglobin has dropped to 8.1 (when she came in, it was 12.9, but her baseline as an outpatient at her primary physician's office 3 weeks ago was 14.0).  She has received approximately a 2 L fluid bolus, and is currently receiving her first of 2 units of packed red cells.  She was sent to CT angiography, which is positive for active bleeding in the region of the cecum.  The patient herself is without abdominal pain.  Also, no chest pain or shortness of breath.  She had coronary stents placed about 6 years ago by her report, but she indicates that she had a stress test several months ago that was negative.   Past Medical History:  Diagnosis Date  . Anginal pain ([place])   . Coronary artery disease   . Hyperlipidemia   . Hypertension   . Thyroid disease    Has had to intermittently take thyroid medicine in the past  . Tobacco abuse     Past Surgical History:  Procedure Laterality Date  . BREAST EXCISIONAL BIOPSY Left 1988  . BREAST  LUMPECTOMY    . CHOLECYSTECTOMY    . LEFT HEART CATHETERIZATION WITH CORONARY ANGIOGRAM N/A 10/21/2012   Procedure: LEFT HEART CATHETERIZATION WITH CORONARY ANGIOGRAM;  Surgeon: Wellington Hampshire, MD;  Location: [facility] CATH [facility];  Service: Cardiovascular;  Laterality: N/A;  . TONSILLECTOMY    . TUBAL LIGATION      Prior to Admission medications   Medication Sig Start Date End Date Taking? Authorizing Provider  aspirin EC 81 MG EC tablet Take 1 tablet (81 mg total) by mouth daily. 10/22/12   Othella Boyer, MD  atorvastatin (LIPITOR) 80 MG tablet Take 1 tablet (80 mg total) by mouth daily at 6 PM. 10/22/12   Sharda, Delia Chimes, MD  cholecalciferol (VITAMIN D) 1000 UNITS tablet Take 4,000 Units by mouth daily.    [provider]  clopidogrel (PLAVIX) 75 MG tablet Take 1 tablet by mouth once daily with breakfast 09/28/19   Josue Hector, MD  hydrochlorothiazide (MICROZIDE) 12.5 MG capsule Take 1 capsule (12.5 mg total) by mouth daily. 06/05/18   Josue Hector, MD  losartan (COZAAR) 50 MG tablet Take 1 tablet (50 mg total) by mouth daily. 06/05/18   Josue Hector, MD  losartan-hydrochlorothiazide (HYZAAR) 50-12.5 MG tablet TAKE 1 TABLET BY MOUTH EVERY DAY 08/26/19   Josue Hector, MD  Magnesium 400 MG TABS Take 1 tablet by mouth daily.    [provider]  metoprolol tartrate (LOPRESSOR)  50 MG tablet Take 1 tablet by mouth twice daily 07/01/19   Josue Hector, MD  naproxen sodium (ANAPROX) 220 MG tablet Take 220 mg by mouth daily as needed. For pain    [provider]  nitroGLYCERIN (NITROSTAT) 0.4 MG SL tablet Place 1 tablet (0.4 mg total) under the tongue every 5 (five) minutes as needed for chest pain (up to 3 times). 11/12/19   Josue Hector, MD    Current Facility-Administered Medications  Medication Dose Route Frequency Provider Last Rate Last Admin  . 0.9 %  sodium chloride infusion   Intravenous Continuous Shela Leff, MD 125 mL/hr at 03/25/20 0657 New Bag at  03/25/20 0657  . 0.9 %  sodium chloride infusion  10 mL/hr Intravenous Once Veryl Speak, MD       Current Outpatient Medications  Medication Sig Dispense Refill  . aspirin EC 81 MG EC tablet Take 1 tablet (81 mg total) by mouth daily.    Marland Kitchen atorvastatin (LIPITOR) 80 MG tablet Take 1 tablet (80 mg total) by mouth daily at 6 PM. 30 tablet 1  . cholecalciferol (VITAMIN D) 1000 UNITS tablet Take 4,000 Units by mouth daily.    . clopidogrel (PLAVIX) 75 MG tablet Take 1 tablet by mouth once daily with breakfast 90 tablet 2  . hydrochlorothiazide (MICROZIDE) 12.5 MG capsule Take 1 capsule (12.5 mg total) by mouth daily. 90 capsule 3  . losartan (COZAAR) 50 MG tablet Take 1 tablet (50 mg total) by mouth daily. 90 tablet 3  . losartan-hydrochlorothiazide (HYZAAR) 50-12.5 MG tablet TAKE 1 TABLET BY MOUTH EVERY DAY 90 tablet 2  . Magnesium 400 MG TABS Take 1 tablet by mouth daily.    . metoprolol tartrate (LOPRESSOR) 50 MG tablet Take 1 tablet by mouth twice daily 180 tablet 3  . naproxen sodium (ANAPROX) 220 MG tablet Take 220 mg by mouth daily as needed. For pain    . nitroGLYCERIN (NITROSTAT) 0.4 MG SL tablet Place 1 tablet (0.4 mg total) under the tongue every 5 (five) minutes as needed for chest pain (up to 3 times). 25 tablet 2    Allergies as of 03/25/2020 - Review Complete 03/25/2020  Allergen Reaction Noted  . Codeine Nausea And Vomiting 10/20/2012  . Other  06/28/2013    Family History  Problem Relation Age of Onset  . Coronary artery disease Father 69       CABG x4 at age 34, repeat 2 vessel CABG at age 34. Died of congestive heart failure 10 years later  . Heart failure Father   . Hemochromatosis Mother   . COPD Mother   . Coronary artery disease Paternal Uncle   . Hyperlipidemia Brother   . Breast cancer Paternal Aunt     Social History   Socioeconomic History  . Marital status: Married    Spouse name: Not on file  . Number of children: Not on file  . Years of education:  Not on file  . Highest education level: Not on file  Occupational History  . Not on file  Tobacco Use  . Smoking status: Former Smoker    Packs/day: 1.00    Years: 40.00    Pack years: 40.00    Types: Cigarettes  . Smokeless tobacco: Never Used  Substance and Sexual Activity  . Alcohol use: No  . Drug use: No  . Sexual activity: Not on file  Other Topics Concern  . Not on file  Social History Narrative  . Not  on file   Social Determinants of Health   Financial Resource Strain:   . Difficulty of Paying Living Expenses: Not on file  Food Insecurity:   . Worried About Charity fundraiser in the Last Year: Not on file  . Ran Out of Food in the Last Year: Not on file  Transportation Needs:   . Lack of Transportation (Medical): Not on file  . Lack of Transportation (Non-Medical): Not on file  Physical Activity:   . Days of Exercise per Week: Not on file  . Minutes of Exercise per Session: Not on file  Stress:   . Feeling of Stress : Not on file  Social Connections:   . Frequency of Communication with Friends and Family: Not on file  . Frequency of Social Gatherings with Friends and Family: Not on file  . Attends Religious Services: Not on file  . Active Member of Clubs or Organizations: Not on file  . Attends Archivist Meetings: Not on file  . Marital Status: Not on file  Intimate Partner Violence:   . Fear of Current or Ex-Partner: Not on file  . Emotionally Abused: Not on file  . Physically Abused: Not on file  . Sexually Abused: Not on file    Review of Systems: See HPI  Physical Exam: Vital signs in last 24 hours: Temp:  [97.8 F (36.6 C)-98.5 F (36.9 C)] 98.1 F (36.7 C) (10/02 0707) Pulse Rate:  [50-79] 72 (10/02 0715) Resp:  [12-21] 16 (10/02 0715) BP: (79-139)/(41-74) 106/41 (10/02 0715) SpO2:  [96 %-100 %] 100 % (10/02 0715)   Husband at bedside.  Patient lying flat on ER stretcher in no distress whatsoever.  Current systolic blood  pressure 562, heart rate 64.  Skin is warm and well perfused.  Radial pulses reasonably full.  Chest is clear anteriorly, heart is without murmur.  Abdomen is significantly adipose but without any evident tenderness.  Intake/Output from previous day: 10/01 0701 - 10/02 0700 In: 1800 [I.V.:1800] Out: 2000 [Stool:2000] Intake/Output this shift: No intake/output data recorded.  Lab Results: Recent Labs    03/25/20 0130 03/25/20 0354 03/25/20 0627  WBC 13.4*  --   --   HGB 12.7 10.7* 8.1*  HCT 38.2 31.9* 25.4*  PLT 290  --   --    BMET Recent Labs    03/25/20 0130  NA 142  K 3.8  CL 107  CO2 26  GLUCOSE 134*  BUN 15  CREATININE 0.74  CALCIUM 9.3   LFT Recent Labs    03/25/20 0130  PROT 6.0*  ALBUMIN 3.8  AST 28  ALT 18  ALKPHOS 76  BILITOT 0.9   PT/INR No results for input(s): LABPROT, INR in the last 72 hours.  Studies/Results: CT Angio Abd/Pel w/ and/or w/o  Result Date: 03/25/2020 CLINICAL DATA:  Bright red blood per rectum since midnight. Polypectomy at colonoscopy yesterday. EXAM: CTA ABDOMEN AND PELVIS WITHOUT AND WITH CONTRAST TECHNIQUE: Multidetector CT imaging of the abdomen and pelvis was performed using the standard protocol during bolus administration of intravenous contrast. Multiplanar reconstructed images and MIPs were obtained and reviewed to evaluate the vascular anatomy. CONTRAST:  168mL OMNIPAQUE IOHEXOL 350 MG/ML SOLN COMPARISON:  None. FINDINGS: VASCULAR Aorta: Atherosclerotic nonaneurysmal abdominal aorta with no dissection or stenosis. Celiac: Patent without evidence of aneurysm, dissection, vasculitis or significant stenosis. SMA: Patent without evidence of aneurysm, dissection, vasculitis or significant stenosis. Renals: Both renal arteries are patent without evidence of aneurysm, dissection, vasculitis, fibromuscular  dysplasia or significant stenosis. IMA: Patent without evidence of aneurysm, dissection, vasculitis or significant stenosis.  Inflow: Patent without evidence of aneurysm, dissection, vasculitis or significant stenosis. Proximal Outflow: Bilateral common femoral and visualized portions of the superficial and profunda femoral arteries are patent without evidence of aneurysm, dissection, vasculitis or significant stenosis. Veins: No venous abnormality. Review of the MIP images confirms the above findings. NON-VASCULAR Lower chest: No significant pulmonary nodules or acute consolidative airspace disease. Right coronary atherosclerosis. Hepatobiliary: Normal liver size. No liver masses. Tiny granulomatous left liver calcifications. Cholecystectomy. No biliary ductal dilatation. Pancreas: Normal, with no mass or duct dilation. Spleen: Normal size. No mass. Adrenals/Urinary Tract: Normal adrenals. No hydronephrosis. Scattered subcentimeter hypodense left renal cortical lesions are too small to characterize and require no follow-up. No suspicious renal masses. Normal bladder. Stomach/Bowel: Small hiatal hernia. Otherwise normal nondistended stomach. Normal caliber small bowel with no small bowel wall thickening. Normal appendix. Moderate sigmoid diverticulosis. There is streaky intraluminal hyperdensity in the cecum increasing between the arterial and venous phase sequences suspicious for active intraluminal hemorrhage (series 6/image 71 and series 12/image 43). No large bowel wall thickening. Vascular/Lymphatic: No pathologically enlarged lymph nodes in the abdomen or pelvis. Reproductive: Grossly normal uterus.  No adnexal mass. Other: No pneumoperitoneum, ascites or focal fluid collection. Musculoskeletal: No aggressive appearing focal osseous lesions. Moderate thoracolumbar spondylosis. IMPRESSION: 1. CT angiogram findings are suggestive of active intraluminal hemorrhage in the cecum. No free air. 2. Moderate sigmoid diverticulosis. 3. Small hiatal hernia. 4. Coronary atherosclerosis. 5. Aortic Atherosclerosis (ICD10-I70.0). Critical  Value/emergent results were called by telephone at the time of interpretation on 03/25/2020 at 6:59 am to provider DR. DELO, who verbally acknowledged these results. Electronically Signed   By: Ilona Sorrel M.D.   On: 03/25/2020 07:01    Impression: 1.  Acute post polypectomy hemorrhage with moderate hemodynamic instability  2.  Acute posthemorrhagic anemia  3.  History of coronary disease, status post coronary stent placement many years ago, on aspirin and Plavix (most recent Plavix 3 days ago)  4.  Status post removal of multiple polyps yesterday (this was the patient's first ever colonoscopy, done for heme positive stool)  5.  Diverticulosis    Plan: The patient needs intervention to stop her bleeding.  I have discussed the case with Dr. Earleen Newport of interventional radiology, who feels that she is a good candidate for embolization.  In my opinion, that would be superior to an attempted colonoscopic intervention with clipping of the bleeding site, for several reasons.  First, it may be difficult to find the bleeding site colonoscopically, because of the presence of large amounts of blood.  Second, clipping is not always completely effective in a resting bleeding.  Third, the patient's blood pressures are soft, and more sedation would likely be needed for colonoscopy eval for an IR procedure.  I have discussed this with patient and her husband and they are agreeable.  I will be on standby for possible colonoscopy in the event that IR embolization is not successful, and we will continue to follow the patient with you.   LOS: 0 days   Youlanda Mighty Shermon Bozzi  03/25/2020, 7:48 AM   Pager [phone] If no answer or after 5 PM call [phone]

## 2020-03-25 NOTE — H&P (Signed)
History and Physical    Cassandra Castillo GXQ:[id number] DOB: 18-Feb-1946 DOA: 03/25/2020  PCP: Shirline Frees, MD Patient coming from: Home  Chief Complaint: Rectal bleeding  HPI: Cassandra Castillo is a 74 y.o. female with medical history significant of CAD with stents on aspirin and Plavix, hypertension, hyperlipidemia presenting to the ED via EMS after having multiple episodes of rectal bleeding/ BRBPR.  Patient states she had a colonoscopy done yesterday and several polyps were removed. This evening she started having recurrent bloody bowel movements. She called the on-call gastroenterologist for [facility] and was advised to come to the ER to be evaluated.  Denies abdominal pain.  She has not vomited any blood.  States her Plavix was stopped 3 days prior to her colonoscopy and she was advised to continue taking baby aspirin.  Plan was to resume Plavix 2 days after the colonoscopy.  States she passed out in triage.  Denies any lightheadedness/dizziness at present.  Denies shortness of breath or chest pain.  ED Course: Vital signs stable upon arrival to the ED. While in triage she had a syncopal episode which lasted several seconds then resolved. Labs done in the ED showing WBC 13.4, hemoglobin 12.7, hematocrit 38.2, platelet 290K. Sodium 142, potassium 3.8, chloride 107, bicarb 26, BUN 15, creatinine 0.7, glucose 134. LFTs normal. FOBT pending. She has had multiple episodes of large-volume rectal bleeding in the ED. Blood pressure dropped to systolic in the 14G, not tachycardic. H&H pending. SARS-CoV-2 PCR test pending.  Patient received 1.5 L fluid boluses in the ED.  Review of Systems:  All systems reviewed and apart from history of presenting illness, are negative.  Past Medical History:  Diagnosis Date  . Anginal pain ([place])   . Coronary artery disease   . Hyperlipidemia   . Hypertension   . Thyroid disease    Has had to intermittently take thyroid medicine in the past  . Tobacco abuse     Past  Surgical History:  Procedure Laterality Date  . BREAST EXCISIONAL BIOPSY Left 1988  . BREAST LUMPECTOMY    . CHOLECYSTECTOMY    . LEFT HEART CATHETERIZATION WITH CORONARY ANGIOGRAM N/A 10/21/2012   Procedure: LEFT HEART CATHETERIZATION WITH CORONARY ANGIOGRAM;  Surgeon: Wellington Hampshire, MD;  Location: [place] [facility] CATH LAB;  Service: Cardiovascular;  Laterality: N/A;  . TONSILLECTOMY    . TUBAL LIGATION       reports that she has quit smoking. Her smoking use included cigarettes. She has a 40.00 pack-year smoking history. She has never used smokeless tobacco. She reports that she does not drink alcohol and does not use drugs.  Allergies  Allergen Reactions  . Codeine Nausea And Vomiting  . Other     Sensitive to pain and anesthesia medications    Family History  Problem Relation Age of Onset  . Coronary artery disease Father 23       CABG x4 at age 62, repeat 2 vessel CABG at age 82. Died of congestive heart failure 10 years later  . Heart failure Father   . Hemochromatosis Mother   . COPD Mother   . Coronary artery disease Paternal Uncle   . Hyperlipidemia Brother   . Breast cancer Paternal Aunt     Prior to Admission medications   Medication Sig Start Date End Date Taking? Authorizing Provider  aspirin EC 81 MG EC tablet Take 1 tablet (81 mg total) by mouth daily. 10/22/12   Othella Boyer, MD  atorvastatin (LIPITOR) 80 MG tablet Take 1  tablet (80 mg total) by mouth daily at 6 PM. 10/22/12   Sharda, Delia Chimes, MD  cholecalciferol (VITAMIN D) 1000 UNITS tablet Take 4,000 Units by mouth daily.    [provider]  clopidogrel (PLAVIX) 75 MG tablet Take 1 tablet by mouth once daily with breakfast 09/28/19   Josue Hector, MD  hydrochlorothiazide (MICROZIDE) 12.5 MG capsule Take 1 capsule (12.5 mg total) by mouth daily. 06/05/18   Josue Hector, MD  losartan (COZAAR) 50 MG tablet Take 1 tablet (50 mg total) by mouth daily. 06/05/18   Josue Hector, MD  losartan-hydrochlorothiazide  (HYZAAR) 50-12.5 MG tablet TAKE 1 TABLET BY MOUTH EVERY DAY 08/26/19   Josue Hector, MD  Magnesium 400 MG TABS Take 1 tablet by mouth daily.    [provider]  metoprolol tartrate (LOPRESSOR) 50 MG tablet Take 1 tablet by mouth twice daily 07/01/19   Josue Hector, MD  naproxen sodium (ANAPROX) 220 MG tablet Take 220 mg by mouth daily as needed. For pain    [provider]  nitroGLYCERIN (NITROSTAT) 0.4 MG SL tablet Place 1 tablet (0.4 mg total) under the tongue every 5 (five) minutes as needed for chest pain (up to 3 times). 11/12/19   Josue Hector, MD    Physical Exam: Vitals:   03/25/20 0415 03/25/20 0430 03/25/20 0445 03/25/20 0515  BP: (!) 105/56 (!) 110/46 (!) 99/53 99/62  Pulse: 70 60 (!) 56 64  Resp: 17 (!) 21 19 16   Temp:      TempSrc:      SpO2: 99% 100% 100% 100%    Physical Exam Constitutional:      General: She is not in acute distress. HENT:     Head: Normocephalic and atraumatic.  Eyes:     Extraocular Movements: Extraocular movements intact.     Conjunctiva/sclera: Conjunctivae normal.  Cardiovascular:     Rate and Rhythm: Normal rate and regular rhythm.     Pulses: Normal pulses.  Pulmonary:     Effort: Pulmonary effort is normal. No respiratory distress.     Breath sounds: No wheezing or rales.  Abdominal:     General: Bowel sounds are normal. There is no distension.     Palpations: Abdomen is soft.     Tenderness: There is no abdominal tenderness. There is no guarding or rebound.  Musculoskeletal:        General: No swelling or tenderness.     Cervical back: Normal range of motion and neck supple.  Skin:    General: Skin is warm and dry.  Neurological:     General: No focal deficit present.     Mental Status: She is alert and oriented to person, place, and time.     Labs on Admission: I have personally reviewed following labs and imaging studies  CBC: Recent Labs  Lab 03/25/20 0130 03/25/20 0354  WBC 13.4*  --   HGB 12.7  10.7*  HCT 38.2 31.9*  MCV 90.5  --   PLT 290  --    Basic Metabolic Panel: Recent Labs  Lab 03/25/20 0130  NA 142  K 3.8  CL 107  CO2 26  GLUCOSE 134*  BUN 15  CREATININE 0.74  CALCIUM 9.3   GFR: CrCl cannot be calculated (Unknown ideal weight.). Liver Function Tests: Recent Labs  Lab 03/25/20 0130  AST 28  ALT 18  ALKPHOS 76  BILITOT 0.9  PROT 6.0*  ALBUMIN 3.8   No results  for input(s): LIPASE, AMYLASE in the last 168 hours. No results for input(s): AMMONIA in the last 168 hours. Coagulation Profile: No results for input(s): INR, PROTIME in the last 168 hours. Cardiac Enzymes: No results for input(s): CKTOTAL, CKMB, CKMBINDEX, TROPONINI in the last 168 hours. BNP (last 3 results) No results for input(s): PROBNP in the last 8760 hours. HbA1C: No results for input(s): HGBA1C in the last 72 hours. CBG: No results for input(s): GLUCAP in the last 168 hours. Lipid Profile: No results for input(s): CHOL, HDL, LDLCALC, TRIG, CHOLHDL, LDLDIRECT in the last 72 hours. Thyroid Function Tests: No results for input(s): TSH, T4TOTAL, FREET4, T3FREE, THYROIDAB in the last 72 hours. Anemia Panel: No results for input(s): VITAMINB12, FOLATE, FERRITIN, TIBC, IRON, RETICCTPCT in the last 72 hours. Urine analysis: No results found for: COLORURINE, APPEARANCEUR, LABSPEC, PHURINE, GLUCOSEU, HGBUR, BILIRUBINUR, KETONESUR, PROTEINUR, UROBILINOGEN, NITRITE, LEUKOCYTESUR  Radiological Exams on Admission: No results found.  EKG: Independently reviewed. Sinus rhythm, no acute changes.  Assessment/Plan Principal Problem:   GI bleed Active Problems:   HTN (hypertension)   CAD (coronary artery disease)   Acute GI bleed: Patient presenting with multiple episodes of rectal bleeding status post polypectomy yesterday. She has had multiple episodes of passing large volume bright red and clotted blood while in the ED.  Blood pressure low with systolic in the 27O at present.  Not  tachycardic.  AAOx4.  Initial hemoglobin 12.7, repeat 10.7. -Admit to stepdown unit and monitor hemodynamics closely.  2 large-bore IVs.  Order additional fluid bolus and continue IV fluid hydration.  Type and screen, serial H&H.  Transfuse PRBCs if hemoglobin continues to drop.  Keep n.p.o. Dr. Cristina Gong recommending obtaining CT angiogram to determine if she is actively bleeding, ordered.  GI will consult in a.m.  CAD -Hold aspirin and Plavix  Hypertension -Hold antihypertensives at this time  DVT prophylaxis: SCDs Code Status: Patient wishes to be full code. Family Communication: No family at bedside. Disposition Plan: Status is: Observation  The patient remains OBS appropriate and will d/c before 2 midnights.  Dispo: The patient is from: Home              Anticipated d/c is to: Home              Anticipated d/c date is: 2 days              Patient currently is not medically stable to d/c.  The medical decision making on this patient was of high complexity and the patient is at high risk for clinical deterioration, therefore this is a level 3 visit.  Shela Leff MD [facility]  If 7PM-7AM, please contact night-coverage [email or web address]  03/25/2020, 5:48 AM

## 2020-03-25 NOTE — Significant Event (Signed)
Rapid Response Event Note   Reason for Call :  Hypotension   Initial Focused Assessment:  Received a call from RN stating that her patient has become hypotensive after her IR procedure.  Patient just returned from IR, had a large bright red BM, brady'd down to 40s, and is hypotensive.  The patient was very pale but alert.  She is AOx4.  MDs at bedside.  BP 107/47 (70) ECG 68 RR 22 O2 97    Interventions:  MDs ordered CBC and IV fluids.  I assisted with getting the patient cleaned up.  Plan of Care:  Patient will remain on the unit.  Labwork pending   Event Summary:   MD Notified: at bedside Call Time: [place] End Time: [place], RN

## 2020-03-25 NOTE — ED Notes (Signed)
bp droping

## 2020-03-25 NOTE — Progress Notes (Signed)
As of approximately 7 PM, the patient had been without any further bleeding since her large output with apparent vagal symptoms approximately 6 hours earlier.  Her hemoglobin around 3 PM came back at 9.8.  At this time, the patient is coherent, and reasonably comfortable.  Systolic blood pressure is 106, heart rate 83.  Skin is warm and well perfused.  Impression: So far, it appears that the patient is not having ongoing bleeding, and that her major "blowout" with hypotension and bradycardia earlier this afternoon was due to vigorous passage of copious amounts of old blood, with a vagal reaction.  Recommendation: Continue close clinical observation with frequent blood draws (unfortunately, phlebotomy services are limited due to short staffing, so we are not getting the blood drawn as frequently as requested).  Hold off on colonoscopy unless the patient shows clear signs of further bleeding.  Transfuse as needed for anemia associated either with equilibration or further blood loss.  Possible initiation of clear liquid diet tomorrow if stable overnight.  Cleotis Nipper, M.D. Pager [phone] If no answer or after 5 PM call [phone]

## 2020-03-25 NOTE — ED Provider Notes (Addendum)
[facility]ERGENCY DEPARTMENT Provider Note   CSN: [id number] Arrival date & time: 03/25/20  0058     History Chief Complaint  Patient presents with  . Rectal Bleeding    Cassandra Castillo is a 74 y.o. female.  Patient is a 74 year old female with history of coronary artery disease with stents, hypertension, hyperlipidemia.  Patient presents today for evaluation of rectal bleeding.  She had a colonoscopy performed yesterday morning during which time she had 11 polyps removed.  This evening she began having recurrent bloody bowel movements.  She reports multiple episodes of bright red blood.  She called the on-call gastroenterologist who told her to come to the ER to be evaluated.  Patient was brought by EMS.  While being triaged, she had some sort of syncopal episode which lasted for several seconds, then resolved.  She denies to me she is having any abdominal pain or fever.  Patient taking Plavix and baby aspirin daily.  Her gastroenterologist is Dr. Watt Climes with [facility].  The history is provided by the patient.  Rectal Bleeding Quality:  Bright red Amount:  Moderate Chronicity:  New Relieved by:  Nothing Worsened by:  Nothing Ineffective treatments:  None tried Associated symptoms: no abdominal pain, no fever and no hematemesis        Past Medical History:  Diagnosis Date  . Anginal pain ([place])   . Coronary artery disease   . Hyperlipidemia   . Hypertension   . Thyroid disease    Has had to intermittently take thyroid medicine in the past  . Tobacco abuse     Patient Active Problem List   Diagnosis Date Noted  . HTN (hypertension) 06/02/2013  . CAD (coronary artery disease) 06/02/2013  . Hyperlipidemia   . Thyroid disease   . Coronary artery disease   . Chest pain 10/20/2012    Past Surgical History:  Procedure Laterality Date  . BREAST EXCISIONAL BIOPSY Left 1988  . BREAST LUMPECTOMY    . CHOLECYSTECTOMY    . LEFT HEART CATHETERIZATION WITH  CORONARY ANGIOGRAM N/A 10/21/2012   Procedure: LEFT HEART CATHETERIZATION WITH CORONARY ANGIOGRAM;  Surgeon: Wellington Hampshire, MD;  Location: [place] CATH LAB;  Service: Cardiovascular;  Laterality: N/A;  . TONSILLECTOMY    . TUBAL LIGATION       OB History   No obstetric history on file.     Family History  Problem Relation Age of Onset  . Coronary artery disease Father 69       CABG x4 at age 35, repeat 2 vessel CABG at age 12. Died of congestive heart failure 10 years later  . Heart failure Father   . Hemochromatosis Mother   . COPD Mother   . Coronary artery disease Paternal Uncle   . Hyperlipidemia Brother   . Breast cancer Paternal Aunt     Social History   Tobacco Use  . Smoking status: Former Smoker    Packs/day: 1.00    Years: 40.00    Pack years: 40.00    Types: Cigarettes  . Smokeless tobacco: Never Used  Substance Use Topics  . Alcohol use: No  . Drug use: No    Home Medications Prior to Admission medications   Medication Sig Start Date End Date Taking? Authorizing Provider  aspirin EC 81 MG EC tablet Take 1 tablet (81 mg total) by mouth daily. 10/22/12   Othella Boyer, MD  atorvastatin (LIPITOR) 80 MG tablet Take 1 tablet (80 mg total) by  mouth daily at 6 PM. 10/22/12   Sharda, Delia Chimes, MD  cholecalciferol (VITAMIN D) 1000 UNITS tablet Take 4,000 Units by mouth daily.    [provider]  clopidogrel (PLAVIX) 75 MG tablet Take 1 tablet by mouth once daily with breakfast 09/28/19   Josue Hector, MD  hydrochlorothiazide (MICROZIDE) 12.5 MG capsule Take 1 capsule (12.5 mg total) by mouth daily. 06/05/18   Josue Hector, MD  losartan (COZAAR) 50 MG tablet Take 1 tablet (50 mg total) by mouth daily. 06/05/18   Josue Hector, MD  losartan-hydrochlorothiazide (HYZAAR) 50-12.5 MG tablet TAKE 1 TABLET BY MOUTH EVERY DAY 08/26/19   Josue Hector, MD  Magnesium 400 MG TABS Take 1 tablet by mouth daily.    [provider]  metoprolol tartrate (LOPRESSOR)  50 MG tablet Take 1 tablet by mouth twice daily 07/01/19   Josue Hector, MD  naproxen sodium (ANAPROX) 220 MG tablet Take 220 mg by mouth daily as needed. For pain    [provider]  nitroGLYCERIN (NITROSTAT) 0.4 MG SL tablet Place 1 tablet (0.4 mg total) under the tongue every 5 (five) minutes as needed for chest pain (up to 3 times). 11/12/19   Josue Hector, MD    Allergies    Codeine and Other  Review of Systems   Review of Systems  Constitutional: Negative for fever.  Gastrointestinal: Positive for hematochezia. Negative for abdominal pain and hematemesis.  All other systems reviewed and are negative.   Physical Exam Updated Vital Signs BP 115/70   Pulse 68   Temp 98.3 F (36.8 C) (Oral)   Resp 16   SpO2 97%   Physical Exam Vitals and nursing note reviewed.  Constitutional:      General: She is not in acute distress.    Appearance: She is well-developed. She is not diaphoretic.  HENT:     Head: Normocephalic and atraumatic.  Cardiovascular:     Rate and Rhythm: Normal rate and regular rhythm.     Heart sounds: No murmur heard.  No friction rub. No gallop.   Pulmonary:     Effort: Pulmonary effort is normal. No respiratory distress.     Breath sounds: Normal breath sounds. No wheezing.  Abdominal:     General: Bowel sounds are normal. There is no distension.     Palpations: Abdomen is soft.     Tenderness: There is no abdominal tenderness.  Musculoskeletal:        General: Normal range of motion.     Cervical back: Normal range of motion and neck supple.  Skin:    General: Skin is warm and dry.  Neurological:     Mental Status: She is alert and oriented to person, place, and time.     ED Results / Procedures / Treatments   Labs (all labs ordered are listed, but only abnormal results are displayed) Labs Reviewed  CBC - Abnormal; Notable for the following components:      Result Value   WBC 13.4 (*)    All other components within normal limits    COMPREHENSIVE METABOLIC PANEL  POC OCCULT BLOOD, ED  TYPE AND SCREEN  ABO/RH    EKG EKG Interpretation  Date/Time:  Saturday March 25 2020 01:38:02 EDT Ventricular Rate:  65 PR Interval:    QRS Duration: 90 QT Interval:  412 QTC Calculation: 429 R Axis:   49 Text Interpretation: Sinus rhythm Normal ECG No significant change since 10/22/2012 Confirmed by  Veryl Speak [phone]) on 03/25/2020 1:51:03 AM   Radiology No results found.  Procedures Procedures (including critical care time)  Medications Ordered in ED Medications  sodium chloride 0.9 % bolus 1,000 mL (has no administration in time range)    ED Course  I have reviewed the triage vital signs and the nursing notes.  Pertinent labs & imaging results that were available during my care of the patient were reviewed by me and considered in my medical decision making (see chart for details).    MDM Rules/Calculators/A&P  Patient is a 74 year old female presenting with rectal bleeding status post polypectomy yesterday.  She has passed bright red and clotted blood on several occasions.  Patient is hemodynamically stable and initial hemoglobin is 12.7.  White count is 13,000, however her abdomen is benign and she is nontoxic-appearing.  Patient to be admitted to the hospitalist service for further monitoring of hemoglobin and vital signs.  Patient may require repeat colonoscopy if she continues to bleed.  Dr. Cristina Gong notified by epic message of the patient's status and admission.  While waiting for GI and Hospitalist consultation, blood pressure continues to trend downward, repeat IV bolus initiated.  She has now had a total of 2200 cc of bloody stool.  I have ordered transfusion of 2 units of PRBCs.  Dr. Cristina Gong updated.    CRITICAL CARE Performed by: Veryl Speak Total critical care time: 50 minutes Critical care time was exclusive of separately billable procedures and treating other patients. Critical care was  necessary to treat or prevent imminent or life-threatening deterioration. Critical care was time spent personally by me on the following activities: development of treatment plan with patient and/or surrogate as well as nursing, discussions with consultants, evaluation of patient's response to treatment, examination of patient, obtaining history from patient or surrogate, ordering and performing treatments and interventions, ordering and review of laboratory studies, ordering and review of radiographic studies, pulse oximetry and re-evaluation of patient's condition.  Final Clinical Impression(s) / ED Diagnoses Final diagnoses:  None    Rx / DC Orders ED Discharge Orders    None       Veryl Speak, MD 03/25/20 6067    Veryl Speak, MD 03/25/20 [phone]

## 2020-03-25 NOTE — ED Notes (Signed)
The pt has used the bedpan again approx 755ml  Bright red blood

## 2020-03-25 NOTE — ED Notes (Signed)
Pt returned from [place]

## 2020-03-26 DIAGNOSIS — K91840 Postprocedural hemorrhage and hematoma of a digestive system organ or structure following a digestive system procedure: Secondary | ICD-10-CM | POA: Diagnosis present

## 2020-03-26 DIAGNOSIS — K921 Melena: Secondary | ICD-10-CM | POA: Diagnosis present

## 2020-03-26 DIAGNOSIS — K5730 Diverticulosis of large intestine without perforation or abscess without bleeding: Secondary | ICD-10-CM | POA: Diagnosis present

## 2020-03-26 DIAGNOSIS — Y838 Other surgical procedures as the cause of abnormal reaction of the patient, or of later complication, without mention of misadventure at the time of the procedure: Secondary | ICD-10-CM | POA: Diagnosis present

## 2020-03-26 DIAGNOSIS — Z7902 Long term (current) use of antithrombotics/antiplatelets: Secondary | ICD-10-CM | POA: Diagnosis not present

## 2020-03-26 DIAGNOSIS — Z955 Presence of coronary angioplasty implant and graft: Secondary | ICD-10-CM | POA: Diagnosis not present

## 2020-03-26 DIAGNOSIS — E079 Disorder of thyroid, unspecified: Secondary | ICD-10-CM | POA: Diagnosis present

## 2020-03-26 DIAGNOSIS — Z7982 Long term (current) use of aspirin: Secondary | ICD-10-CM | POA: Diagnosis not present

## 2020-03-26 DIAGNOSIS — K922 Gastrointestinal hemorrhage, unspecified: Secondary | ICD-10-CM | POA: Diagnosis present

## 2020-03-26 DIAGNOSIS — Z20822 Contact with and (suspected) exposure to covid-19: Secondary | ICD-10-CM | POA: Diagnosis present

## 2020-03-26 DIAGNOSIS — I10 Essential (primary) hypertension: Secondary | ICD-10-CM

## 2020-03-26 DIAGNOSIS — I959 Hypotension, unspecified: Secondary | ICD-10-CM | POA: Diagnosis present

## 2020-03-26 DIAGNOSIS — R001 Bradycardia, unspecified: Secondary | ICD-10-CM | POA: Diagnosis present

## 2020-03-26 DIAGNOSIS — Z9889 Other specified postprocedural states: Secondary | ICD-10-CM | POA: Diagnosis not present

## 2020-03-26 DIAGNOSIS — E785 Hyperlipidemia, unspecified: Secondary | ICD-10-CM | POA: Diagnosis present

## 2020-03-26 DIAGNOSIS — I2510 Atherosclerotic heart disease of native coronary artery without angina pectoris: Secondary | ICD-10-CM | POA: Diagnosis present

## 2020-03-26 DIAGNOSIS — Z86010 Personal history of colonic polyps: Secondary | ICD-10-CM | POA: Diagnosis not present

## 2020-03-26 DIAGNOSIS — Z8249 Family history of ischemic heart disease and other diseases of the circulatory system: Secondary | ICD-10-CM | POA: Diagnosis not present

## 2020-03-26 DIAGNOSIS — Z87891 Personal history of nicotine dependence: Secondary | ICD-10-CM | POA: Diagnosis not present

## 2020-03-26 DIAGNOSIS — Z9049 Acquired absence of other specified parts of digestive tract: Secondary | ICD-10-CM | POA: Diagnosis not present

## 2020-03-26 DIAGNOSIS — Z9851 Tubal ligation status: Secondary | ICD-10-CM | POA: Diagnosis not present

## 2020-03-26 DIAGNOSIS — D62 Acute posthemorrhagic anemia: Secondary | ICD-10-CM

## 2020-03-26 LAB — CBC
HCT: 25.4 % — ABNORMAL LOW (ref 36.0–46.0)
Hemoglobin: 8.4 g/dL — ABNORMAL LOW (ref 12.0–15.0)
MCH: 29.9 pg (ref 26.0–34.0)
MCHC: 33.1 g/dL (ref 30.0–36.0)
MCV: 90.4 fL (ref 80.0–100.0)
Platelets: 194 10*3/uL (ref 150–400)
RBC: 2.81 MIL/uL — ABNORMAL LOW (ref 3.87–5.11)
RDW: 14.6 % (ref 11.5–15.5)
WBC: 14.3 10*3/uL — ABNORMAL HIGH (ref 4.0–10.5)
nRBC: 0 % (ref 0.0–0.2)

## 2020-03-26 LAB — RENAL FUNCTION PANEL
Albumin: 2.4 g/dL — ABNORMAL LOW (ref 3.5–5.0)
Anion gap: 6 (ref 5–15)
BUN: 8 mg/dL (ref 8–23)
CO2: 21 mmol/L — ABNORMAL LOW (ref 22–32)
Calcium: 7.2 mg/dL — ABNORMAL LOW (ref 8.9–10.3)
Chloride: 114 mmol/L — ABNORMAL HIGH (ref 98–111)
Creatinine, Ser: 0.52 mg/dL (ref 0.44–1.00)
GFR calc Af Amer: >60 mL/min (ref >60)
GFR calc non Af Amer: >60 mL/min (ref >60)
Glucose, Bld: 99 mg/dL (ref 70–99)
Phosphorus: 2.6 mg/dL (ref 2.5–4.6)
Potassium: 2.9 mmol/L — ABNORMAL LOW (ref 3.5–5.1)
Sodium: 141 mmol/L (ref 135–145)

## 2020-03-26 LAB — CBC WITH DIFFERENTIAL/PLATELET
Abs Immature Granulocytes: 0.03 10*3/uL (ref 0.00–0.07)
Basophils Absolute: 0 10*3/uL (ref 0.0–0.1)
Basophils Relative: 0 %
Eosinophils Absolute: 0 10*3/uL (ref 0.0–0.5)
Eosinophils Relative: 0 %
HCT: 24.7 % — ABNORMAL LOW (ref 36.0–46.0)
Hemoglobin: 8.1 g/dL — ABNORMAL LOW (ref 12.0–15.0)
Immature Granulocytes: 0 %
Lymphocytes Relative: 41 %
Lymphs Abs: 4.6 10*3/uL — ABNORMAL HIGH (ref 0.7–4.0)
MCH: 29.7 pg (ref 26.0–34.0)
MCHC: 32.8 g/dL (ref 30.0–36.0)
MCV: 90.5 fL (ref 80.0–100.0)
Monocytes Absolute: 0.6 10*3/uL (ref 0.1–1.0)
Monocytes Relative: 5 %
Neutro Abs: 5.9 10*3/uL (ref 1.7–7.7)
Neutrophils Relative %: 54 %
Platelets: 178 10*3/uL (ref 150–400)
RBC: 2.73 MIL/uL — ABNORMAL LOW (ref 3.87–5.11)
RDW: 14.6 % (ref 11.5–15.5)
WBC: 11.2 10*3/uL — ABNORMAL HIGH (ref 4.0–10.5)
nRBC: 0 % (ref 0.0–0.2)

## 2020-03-26 LAB — MAGNESIUM: Magnesium: 1.5 mg/dL — ABNORMAL LOW (ref 1.7–2.4)

## 2020-03-26 MED ORDER — SODIUM CHLORIDE 0.9 % IV SOLN: INTRAVENOUS | Status: DC

## 2020-03-26 MED ORDER — MAGNESIUM SULFATE 2 GM/50ML IV SOLN
2.0000 g | Freq: Once | INTRAVENOUS | Status: AC
  Administered 2020-03-26: 2 g via INTRAVENOUS
  Filled 2020-03-26: qty 50

## 2020-03-26 MED ORDER — ACETAMINOPHEN 325 MG PO TABS: 650.0000 mg | ORAL_TABLET | ORAL | Status: DC | PRN

## 2020-03-26 MED ORDER — POTASSIUM CHLORIDE CRYS ER 20 MEQ PO TBCR
40.0000 meq | EXTENDED_RELEASE_TABLET | ORAL | Status: AC
  Administered 2020-03-26 (×2): 40 meq via ORAL
  Filled 2020-03-26 (×2): qty 2

## 2020-03-26 MED ORDER — ONDANSETRON HCL 4 MG/2ML IJ SOLN
4.0000 mg | Freq: Four times a day (QID) | INTRAMUSCULAR | Status: DC | PRN
  Administered 2020-03-26: 4 mg via INTRAVENOUS
  Filled 2020-03-26: qty 2

## 2020-03-26 MED ORDER — ACETAMINOPHEN 325 MG PO TABS
650.0000 mg | ORAL_TABLET | Freq: Four times a day (QID) | ORAL | Status: DC | PRN
  Administered 2020-03-26 – 2020-03-27 (×2): 650 mg via ORAL
  Filled 2020-03-26 (×2): qty 2

## 2020-03-26 NOTE — Progress Notes (Signed)
PROGRESS NOTE    Cassandra Castillo  NTZ:[id number] DOB: Aug 20, 1945 DOA: 03/25/2020 PCP: Shirline Frees, MD  Outpatient Specialists:   Brief Narrative:  Patient is a 74 year old female past medical history significant for CAD with stents on aspirin and Plavix, hypertension and hyperlipidemia.  Patient developed bright red blood per rectum following colonoscopy and polypectomy done earlier in the day.  Bleeding has been significant.  On presentation to the hospital, hemoglobin had dropped from 12.7 to 10.7 g/dL on same day.  Repeat hemoglobin was 8.1 g/dL.  ET of the abdomen and pelvis with and without contrast was suggestive of active intraluminal hemorrhage in the cecal area.  Moderate sigmoid diverticulosis was also reported.  GI team, Dr. Ronald Lobo, has been directing care.  Interventional radiology team was consulted and patient underwent angiography and embolization for active extravasation from right colic artery vasa recta.  Patient had another episode of rectal bleed after the embolization, with associated brief episode of bradycardia to the 40s suspected to be vasovagal.  No chest pain or shortness of breath.  No diaphoresis.  Discussed with GI and interventional radiology team.  The plan is for possible colonoscopy if through bleeding persists.  Not clear of the noted bleeding might be old blood.  Surgical team has been consulted and on standby.  ICU team was consulted to assess patient for higher level of care.  ICU team felt patient was not ideal for ICU at the moment.  So far, patient has been transfused 2 units of packed red blood cells.  Patient has been volume resuscitated intermittently.  03/26/2020: Patient seen alongside patient's husband.  No further bleeding reported.  Hemoglobin today is 8.1g/dL.  We will continue to monitor closely.  Possible discharge tomorrow.     Assessment & Plan:   Principal Problem:   GI bleed Active Problems:   HTN (hypertension)   CAD (coronary artery  disease)  Acute GI bleed:  -Patient presenting with multiple episodes of rectal bleeding status post polypectomy on 03/24/2020. -Patient presented with significant rectal bleed (bright red per rectum). -Hemoglobin has dropped from 12 to 8 g/dL. -Intermittent episodes of hypotension.   -Work-up done suffice as documented above. -Patient has undergone angiography and embolization of a bleeding vessel. -Patient had another episode of rectal bleed, with associated vasovagal episode and systolic blood pressure in the mid to upper 80s. -ICU team, surgical team consulted.  -Likely repeat colonoscopy if patient continues to bleed. -GI team is directing care. -Further management depend on hospital course. -Continue to monitor H/H.   04/22/2020: CBC in the morning.  Likely discharge tomorrow.  No further bleeding noted.  CAD -Hold aspirin and Plavix 03/26/2020: As per GI team, hold aspirin for the next 2 days and Plavix for the next 5 days and resume afterwards.  Hypertension -Hold antihypertensives at this time  DVT prophylaxis: SCD Code Status: Full code Family Communication:  Disposition Plan: This will depend on hospital course   Consultants:   GI  Interventional radiology.  General surgery.  Critical care team  Procedures:   Angiography and embolization of bleeding vessel.  Antimicrobials:   None   Subjective: No further rectal bleeding reported.  No chest pain. No shortness of breath.  Objective: Vitals:   03/26/20 0708 03/26/20 0800 03/26/20 1208 03/26/20 1601  BP: (!) 145/60 (!) 111/55 (!) 126/52 (!) 134/52  Pulse: 93 79 63 74  Resp: 16 16 16 16   Temp: 98.2 F (36.8 C)  98 F (36.7 C) 98.1 F (36.7  C)  TempSrc: Oral Oral Oral Oral  SpO2: 97% 93% 98% 98%    Intake/Output Summary (Last 24 hours) at 03/26/2020 1655 Last data filed at 03/26/2020 1500 Gross per 24 hour  Intake 691.79 ml  Output --  Net 691.79 ml   There were no vitals filed for this  visit.  Examination:  General exam: Not in any obvious distress.    Respiratory system: Clear to auscultation.  Cardiovascular system: S1 & S2  Gastrointestinal system: Abdomen is obese, soft and nontender.  Organs are difficult to assess.   Central nervous system: Alert and oriented. No focal neurological deficits. Extremities: No leg edema  Data Reviewed: I have personally reviewed following labs and imaging studies  CBC: Recent Labs  Lab 03/25/20 0130 03/25/20 0354 03/25/20 0627 03/25/20 1513 03/26/20 0010  WBC 13.4*  --   --  13.5* 11.2*  NEUTROABS  --   --   --   --  5.9  HGB 12.7 10.7* 8.1* 9.8* 8.1*  HCT 38.2 31.9* 25.4* 29.9* 24.7*  MCV 90.5  --   --  90.9 90.5  PLT 290  --   --  170 830   Basic Metabolic Panel: Recent Labs  Lab 03/25/20 0130 03/26/20 0010  NA 142 141  K 3.8 2.9*  CL 107 114*  CO2 26 21*  GLUCOSE 134* 99  BUN 15 8  CREATININE 0.74 0.52  CALCIUM 9.3 7.2*  MG  --  1.5*  PHOS  --  2.6   GFR: CrCl cannot be calculated (Unknown ideal weight.). Liver Function Tests: Recent Labs  Lab 03/25/20 0130 03/26/20 0010  AST 28  --   ALT 18  --   ALKPHOS 76  --   BILITOT 0.9  --   PROT 6.0*  --   ALBUMIN 3.8 2.4*   No results for input(s): LIPASE, AMYLASE in the last 168 hours. No results for input(s): AMMONIA in the last 168 hours. Coagulation Profile: No results for input(s): INR, PROTIME in the last 168 hours. Cardiac Enzymes: No results for input(s): CKTOTAL, CKMB, CKMBINDEX, TROPONINI in the last 168 hours. BNP (last 3 results) No results for input(s): PROBNP in the last 8760 hours. HbA1C: No results for input(s): HGBA1C in the last 72 hours. CBG: No results for input(s): GLUCAP in the last 168 hours. Lipid Profile: No results for input(s): CHOL, HDL, LDLCALC, TRIG, CHOLHDL, LDLDIRECT in the last 72 hours. Thyroid Function Tests: No results for input(s): TSH, T4TOTAL, FREET4, T3FREE, THYROIDAB in the last 72 hours. Anemia  Panel: No results for input(s): VITAMINB12, FOLATE, FERRITIN, TIBC, IRON, RETICCTPCT in the last 72 hours. Urine analysis: No results found for: COLORURINE, APPEARANCEUR, LABSPEC, PHURINE, GLUCOSEU, HGBUR, BILIRUBINUR, KETONESUR, PROTEINUR, UROBILINOGEN, NITRITE, LEUKOCYTESUR Sepsis Labs: @LABRCNTIP (procalcitonin:4,lacticidven:4)  ) Recent Results (from the past 240 hour(s))  Respiratory Panel by RT PCR (Flu A&B, Covid) - Nasopharyngeal Swab     Status: None   Collection Time: 03/25/20  4:04 AM   Specimen: Nasopharyngeal Swab  Result Value Ref Range Status   SARS Coronavirus 2 by RT PCR NEGATIVE NEGATIVE Final    Comment: (NOTE) SARS-CoV-2 target nucleic acids are NOT DETECTED.  The SARS-CoV-2 RNA is generally detectable in upper respiratoy specimens during the acute phase of infection. The lowest concentration of SARS-CoV-2 viral copies this assay can detect is 131 copies/mL. A negative result does not preclude SARS-Cov-2 infection and should not be used as the sole basis for treatment or other patient management decisions. A negative result  may occur with  improper specimen collection/handling, submission of specimen other than nasopharyngeal swab, presence of viral mutation(s) within the areas targeted by this assay, and inadequate number of viral copies (<131 copies/mL). A negative result must be combined with clinical observations, patient history, and epidemiological information. The expected result is Negative.  Fact Sheet for Patients:  PinkCheek.be  Fact Sheet for Healthcare Providers:  GravelBags.it  This test is no t yet approved or cleared by the [place] FDA and  has been authorized for detection and/or diagnosis of SARS-CoV-2 by FDA under an Emergency Use Authorization (EUA). This EUA will remain  in effect (meaning this test can be used) for the duration of the COVID-19 declaration under Section  564(b)(1) of the Act, 21 U.S.C. section 360bbb-3(b)(1), unless the authorization is terminated or revoked sooner.     Influenza A by PCR NEGATIVE NEGATIVE Final   Influenza B by PCR NEGATIVE NEGATIVE Final    Comment: (NOTE) The Xpert Xpress SARS-CoV-2/FLU/RSV assay is intended as an aid in  the diagnosis of influenza from Nasopharyngeal swab specimens and  should not be used as a sole basis for treatment. Nasal washings and  aspirates are unacceptable for Xpert Xpress SARS-CoV-2/FLU/RSV  testing.  Fact Sheet for Patients: PinkCheek.be  Fact Sheet for Healthcare Providers: GravelBags.it  This test is not yet approved or cleared by the [place] FDA and  has been authorized for detection and/or diagnosis of SARS-CoV-2 by  FDA under an Emergency Use Authorization (EUA). This EUA will remain  in effect (meaning this test can be used) for the duration of the  Covid-19 declaration under Section 564(b)(1) of the Act, 21  U.S.C. section 360bbb-3(b)(1), unless the authorization is  terminated or revoked. Performed at [facility], [place] [address] [place], [place] [address]          Radiology Studies: IR Angiogram Visceral Selective  Result Date: 03/25/2020 INDICATION: 74 year old female with a history polypectomy of the right colon and acute lower GI hemorrhage. She presents for angiogram and embolization. EXAM: ULTRASOUND-GUIDED ACCESS RIGHT COMMON FEMORAL ARTERY MESENTERIC ANGIOGRAM INCLUDING CELIAC ARTERY AND SMA WITH SUPER SELECTION OF SMA BRANCHES COIL EMBOLIZATION RIGHT COLIC VASA RECTA ARTERIAL BRANCHES EXOSEAL FOR HEMOSTASIS MEDICATIONS: None ANESTHESIA/SEDATION: Moderate (conscious) sedation was employed during this procedure. A total of Versed 0.5 mg and Fentanyl 25 mcg was administered intravenously. Moderate Sedation Time: 90 minutes. The patient's level of consciousness and vital signs were monitored  continuously by radiology nursing throughout the procedure under my direct supervision. CONTRAST:  64mL OMNIPAQUE IOHEXOL 300 MG/ML SOLN, 32mL OMNIPAQUE IOHEXOL 300 MG/ML SOLN FLUOROSCOPY TIME:  Fluoroscopy Time: 20 minutes 30 seconds (2294 mGy). COMPLICATIONS: None PROCEDURE: Informed consent was obtained from the patient following explanation of the procedure, risks, benefits and alternatives. The patient understands, agrees and consents for the procedure. All questions were addressed. A time out was performed prior to the initiation of the procedure. Maximal barrier sterile technique utilized including caps, mask, sterile gowns, sterile gloves, large sterile drape, hand hygiene, and Betadine prep. Ultrasound survey of the right inguinal region was performed with images stored and sent to PACs, confirming patency of the vessel. A micropuncture needle was used access the right common femoral artery under ultrasound. With excellent arterial blood flow returned, and an .018 micro wire was passed through the needle, observed enter the abdominal aorta under fluoroscopy. The needle was removed, and a micropuncture sheath was placed over the wire. The inner dilator and wire were removed, and  an 56 Bentson wire was advanced under fluoroscopy into the abdominal aorta. The sheath was removed and a standard 5 [place] vascular sheath was placed. The dilator was removed and the sheath was flushed. Cobra catheter was used to select the celiac artery. Angiogram was performed. Cobra catheter was used to select the SMA. Angiogram was performed, 5 cc/second times 30 cc. Glidewire was then used to navigate the catheter into the right colic artery. Angiogram was performed. Catheter was then withdrawn and a microcatheter system was used to navigate high-flow Renegade 135 cm catheter into the ileocolic artery. Angiogram was performed. Microcatheter was withdrawn and the base catheter was withdrawn to the origin. We then selected the  middle colic artery. This was performed with a combination of the high-flow Renegade catheter, 14 fathom wire, and a synchro soft. Angiogram was performed, with sub selection of the distal marginal artery from the middle colic artery. Microcatheter was withdrawn. We then again selected the right colic artery. The inferior branch of the right colic artery was then selected with the 14 wire. High-flow Renegade was advanced distally. Repeat angiogram was performed. We selected the cecal/appendiceal artery. Angiogram was performed. Catheter was withdrawn. High-flow Renegade was then advanced into the suspected distal right colic artery. Angiogram was performed. This identified the site of the hemorrhage, using multiple obliquities. A catheter exchange was then performed on a synchro exchange wire for an STC microcatheter, 150 cm length. Once the microcatheter was in the arteries of interest, we coil embolized with 2 separate 2 cm coils, 1 of 4 cm length and 1 of 6 cm length. Repeat angiogram was performed. Pull-back repeat angiogram was performed demonstrating no further blush. Catheters and wires were removed and Exoseal was placed for hemostasis. Patient tolerated the procedure well and remained hemodynamically stable throughout. No complications were encountered and no significant blood loss. IMPRESSION: Status post ultrasound guided access right common femoral artery for mesenteric angiogram and coil embolization of the Vasa recta branches of distal right colic artery as the source of post polypectomy hemorrhage. ExoSeal deployed for hemostasis. Signed, Dulcy Fanny. Dellia Nims, RPVI Vascular and Interventional Radiology Specialists [facility] Radiology Electronically Signed   By: Corrie Mckusick D.O.   On: 03/25/2020 13:24   IR Angiogram Visceral Selective  Result Date: 03/25/2020 INDICATION: 74 year old female with a history polypectomy of the right colon and acute lower GI hemorrhage. She presents for angiogram and  embolization. EXAM: ULTRASOUND-GUIDED ACCESS RIGHT COMMON FEMORAL ARTERY MESENTERIC ANGIOGRAM INCLUDING CELIAC ARTERY AND SMA WITH SUPER SELECTION OF SMA BRANCHES COIL EMBOLIZATION RIGHT COLIC VASA RECTA ARTERIAL BRANCHES EXOSEAL FOR HEMOSTASIS MEDICATIONS: None ANESTHESIA/SEDATION: Moderate (conscious) sedation was employed during this procedure. A total of Versed 0.5 mg and Fentanyl 25 mcg was administered intravenously. Moderate Sedation Time: 90 minutes. The patient's level of consciousness and vital signs were monitored continuously by radiology nursing throughout the procedure under my direct supervision. CONTRAST:  63mL OMNIPAQUE IOHEXOL 300 MG/ML SOLN, 92mL OMNIPAQUE IOHEXOL 300 MG/ML SOLN FLUOROSCOPY TIME:  Fluoroscopy Time: 20 minutes 30 seconds (2294 mGy). COMPLICATIONS: None PROCEDURE: Informed consent was obtained from the patient following explanation of the procedure, risks, benefits and alternatives. The patient understands, agrees and consents for the procedure. All questions were addressed. A time out was performed prior to the initiation of the procedure. Maximal barrier sterile technique utilized including caps, mask, sterile gowns, sterile gloves, large sterile drape, hand hygiene, and Betadine prep. Ultrasound survey of the right inguinal region was performed with images stored and sent to PACs,  confirming patency of the vessel. A micropuncture needle was used access the right common femoral artery under ultrasound. With excellent arterial blood flow returned, and an .018 micro wire was passed through the needle, observed enter the abdominal aorta under fluoroscopy. The needle was removed, and a micropuncture sheath was placed over the wire. The inner dilator and wire were removed, and an 035 Bentson wire was advanced under fluoroscopy into the abdominal aorta. The sheath was removed and a standard 5 [place] vascular sheath was placed. The dilator was removed and the sheath was flushed. Cobra  catheter was used to select the celiac artery. Angiogram was performed. Cobra catheter was used to select the SMA. Angiogram was performed, 5 cc/second times 30 cc. Glidewire was then used to navigate the catheter into the right colic artery. Angiogram was performed. Catheter was then withdrawn and a microcatheter system was used to navigate high-flow Renegade 135 cm catheter into the ileocolic artery. Angiogram was performed. Microcatheter was withdrawn and the base catheter was withdrawn to the origin. We then selected the middle colic artery. This was performed with a combination of the high-flow Renegade catheter, 14 fathom wire, and a synchro soft. Angiogram was performed, with sub selection of the distal marginal artery from the middle colic artery. Microcatheter was withdrawn. We then again selected the right colic artery. The inferior branch of the right colic artery was then selected with the 14 wire. High-flow Renegade was advanced distally. Repeat angiogram was performed. We selected the cecal/appendiceal artery. Angiogram was performed. Catheter was withdrawn. High-flow Renegade was then advanced into the suspected distal right colic artery. Angiogram was performed. This identified the site of the hemorrhage, using multiple obliquities. A catheter exchange was then performed on a synchro exchange wire for an STC microcatheter, 150 cm length. Once the microcatheter was in the arteries of interest, we coil embolized with 2 separate 2 cm coils, 1 of 4 cm length and 1 of 6 cm length. Repeat angiogram was performed. Pull-back repeat angiogram was performed demonstrating no further blush. Catheters and wires were removed and Exoseal was placed for hemostasis. Patient tolerated the procedure well and remained hemodynamically stable throughout. No complications were encountered and no significant blood loss. IMPRESSION: Status post ultrasound guided access right common femoral artery for mesenteric angiogram and  coil embolization of the Vasa recta branches of distal right colic artery as the source of post polypectomy hemorrhage. ExoSeal deployed for hemostasis. Signed, Dulcy Fanny. Dellia Nims, RPVI Vascular and Interventional Radiology Specialists [facility] Radiology Electronically Signed   By: Corrie Mckusick D.O.   On: 03/25/2020 13:24   IR Angiogram Selective Each Additional Vessel  Result Date: 03/25/2020 INDICATION: 74 year old female with a history polypectomy of the right colon and acute lower GI hemorrhage. She presents for angiogram and embolization. EXAM: ULTRASOUND-GUIDED ACCESS RIGHT COMMON FEMORAL ARTERY MESENTERIC ANGIOGRAM INCLUDING CELIAC ARTERY AND SMA WITH SUPER SELECTION OF SMA BRANCHES COIL EMBOLIZATION RIGHT COLIC VASA RECTA ARTERIAL BRANCHES EXOSEAL FOR HEMOSTASIS MEDICATIONS: None ANESTHESIA/SEDATION: Moderate (conscious) sedation was employed during this procedure. A total of Versed 0.5 mg and Fentanyl 25 mcg was administered intravenously. Moderate Sedation Time: 90 minutes. The patient's level of consciousness and vital signs were monitored continuously by radiology nursing throughout the procedure under my direct supervision. CONTRAST:  73mL OMNIPAQUE IOHEXOL 300 MG/ML SOLN, 43mL OMNIPAQUE IOHEXOL 300 MG/ML SOLN FLUOROSCOPY TIME:  Fluoroscopy Time: 20 minutes 30 seconds (2294 mGy). COMPLICATIONS: None PROCEDURE: Informed consent was obtained from the patient following explanation of the procedure, risks,  benefits and alternatives. The patient understands, agrees and consents for the procedure. All questions were addressed. A time out was performed prior to the initiation of the procedure. Maximal barrier sterile technique utilized including caps, mask, sterile gowns, sterile gloves, large sterile drape, hand hygiene, and Betadine prep. Ultrasound survey of the right inguinal region was performed with images stored and sent to PACs, confirming patency of the vessel. A micropuncture needle was used  access the right common femoral artery under ultrasound. With excellent arterial blood flow returned, and an .018 micro wire was passed through the needle, observed enter the abdominal aorta under fluoroscopy. The needle was removed, and a micropuncture sheath was placed over the wire. The inner dilator and wire were removed, and an 035 Bentson wire was advanced under fluoroscopy into the abdominal aorta. The sheath was removed and a standard 5 [place] vascular sheath was placed. The dilator was removed and the sheath was flushed. Cobra catheter was used to select the celiac artery. Angiogram was performed. Cobra catheter was used to select the SMA. Angiogram was performed, 5 cc/second times 30 cc. Glidewire was then used to navigate the catheter into the right colic artery. Angiogram was performed. Catheter was then withdrawn and a microcatheter system was used to navigate high-flow Renegade 135 cm catheter into the ileocolic artery. Angiogram was performed. Microcatheter was withdrawn and the base catheter was withdrawn to the origin. We then selected the middle colic artery. This was performed with a combination of the high-flow Renegade catheter, 14 fathom wire, and a synchro soft. Angiogram was performed, with sub selection of the distal marginal artery from the middle colic artery. Microcatheter was withdrawn. We then again selected the right colic artery. The inferior branch of the right colic artery was then selected with the 14 wire. High-flow Renegade was advanced distally. Repeat angiogram was performed. We selected the cecal/appendiceal artery. Angiogram was performed. Catheter was withdrawn. High-flow Renegade was then advanced into the suspected distal right colic artery. Angiogram was performed. This identified the site of the hemorrhage, using multiple obliquities. A catheter exchange was then performed on a synchro exchange wire for an STC microcatheter, 150 cm length. Once the microcatheter was in  the arteries of interest, we coil embolized with 2 separate 2 cm coils, 1 of 4 cm length and 1 of 6 cm length. Repeat angiogram was performed. Pull-back repeat angiogram was performed demonstrating no further blush. Catheters and wires were removed and Exoseal was placed for hemostasis. Patient tolerated the procedure well and remained hemodynamically stable throughout. No complications were encountered and no significant blood loss. IMPRESSION: Status post ultrasound guided access right common femoral artery for mesenteric angiogram and coil embolization of the Vasa recta branches of distal right colic artery as the source of post polypectomy hemorrhage. ExoSeal deployed for hemostasis. Signed, Dulcy Fanny. Dellia Nims, RPVI Vascular and Interventional Radiology Specialists [facility] Radiology Electronically Signed   By: Corrie Mckusick D.O.   On: 03/25/2020 13:24   IR Angiogram Selective Each Additional Vessel  Result Date: 03/25/2020 INDICATION: 74 year old female with a history polypectomy of the right colon and acute lower GI hemorrhage. She presents for angiogram and embolization. EXAM: ULTRASOUND-GUIDED ACCESS RIGHT COMMON FEMORAL ARTERY MESENTERIC ANGIOGRAM INCLUDING CELIAC ARTERY AND SMA WITH SUPER SELECTION OF SMA BRANCHES COIL EMBOLIZATION RIGHT COLIC VASA RECTA ARTERIAL BRANCHES EXOSEAL FOR HEMOSTASIS MEDICATIONS: None ANESTHESIA/SEDATION: Moderate (conscious) sedation was employed during this procedure. A total of Versed 0.5 mg and Fentanyl 25 mcg was administered intravenously. Moderate Sedation Time:  90 minutes. The patient's level of consciousness and vital signs were monitored continuously by radiology nursing throughout the procedure under my direct supervision. CONTRAST:  93mL OMNIPAQUE IOHEXOL 300 MG/ML SOLN, 104mL OMNIPAQUE IOHEXOL 300 MG/ML SOLN FLUOROSCOPY TIME:  Fluoroscopy Time: 20 minutes 30 seconds (2294 mGy). COMPLICATIONS: None PROCEDURE: Informed consent was obtained from the patient  following explanation of the procedure, risks, benefits and alternatives. The patient understands, agrees and consents for the procedure. All questions were addressed. A time out was performed prior to the initiation of the procedure. Maximal barrier sterile technique utilized including caps, mask, sterile gowns, sterile gloves, large sterile drape, hand hygiene, and Betadine prep. Ultrasound survey of the right inguinal region was performed with images stored and sent to PACs, confirming patency of the vessel. A micropuncture needle was used access the right common femoral artery under ultrasound. With excellent arterial blood flow returned, and an .018 micro wire was passed through the needle, observed enter the abdominal aorta under fluoroscopy. The needle was removed, and a micropuncture sheath was placed over the wire. The inner dilator and wire were removed, and an 035 Bentson wire was advanced under fluoroscopy into the abdominal aorta. The sheath was removed and a standard 5 [place] vascular sheath was placed. The dilator was removed and the sheath was flushed. Cobra catheter was used to select the celiac artery. Angiogram was performed. Cobra catheter was used to select the SMA. Angiogram was performed, 5 cc/second times 30 cc. Glidewire was then used to navigate the catheter into the right colic artery. Angiogram was performed. Catheter was then withdrawn and a microcatheter system was used to navigate high-flow Renegade 135 cm catheter into the ileocolic artery. Angiogram was performed. Microcatheter was withdrawn and the base catheter was withdrawn to the origin. We then selected the middle colic artery. This was performed with a combination of the high-flow Renegade catheter, 14 fathom wire, and a synchro soft. Angiogram was performed, with sub selection of the distal marginal artery from the middle colic artery. Microcatheter was withdrawn. We then again selected the right colic artery. The inferior  branch of the right colic artery was then selected with the 14 wire. High-flow Renegade was advanced distally. Repeat angiogram was performed. We selected the cecal/appendiceal artery. Angiogram was performed. Catheter was withdrawn. High-flow Renegade was then advanced into the suspected distal right colic artery. Angiogram was performed. This identified the site of the hemorrhage, using multiple obliquities. A catheter exchange was then performed on a synchro exchange wire for an STC microcatheter, 150 cm length. Once the microcatheter was in the arteries of interest, we coil embolized with 2 separate 2 cm coils, 1 of 4 cm length and 1 of 6 cm length. Repeat angiogram was performed. Pull-back repeat angiogram was performed demonstrating no further blush. Catheters and wires were removed and Exoseal was placed for hemostasis. Patient tolerated the procedure well and remained hemodynamically stable throughout. No complications were encountered and no significant blood loss. IMPRESSION: Status post ultrasound guided access right common femoral artery for mesenteric angiogram and coil embolization of the Vasa recta branches of distal right colic artery as the source of post polypectomy hemorrhage. ExoSeal deployed for hemostasis. Signed, Dulcy Fanny. Dellia Nims, RPVI Vascular and Interventional Radiology Specialists [facility] Radiology Electronically Signed   By: Corrie Mckusick D.O.   On: 03/25/2020 13:24   IR Angiogram Selective Each Additional Vessel  Result Date: 03/25/2020 INDICATION: 74 year old female with a history polypectomy of the right colon and acute lower GI hemorrhage.  She presents for angiogram and embolization. EXAM: ULTRASOUND-GUIDED ACCESS RIGHT COMMON FEMORAL ARTERY MESENTERIC ANGIOGRAM INCLUDING CELIAC ARTERY AND SMA WITH SUPER SELECTION OF SMA BRANCHES COIL EMBOLIZATION RIGHT COLIC VASA RECTA ARTERIAL BRANCHES EXOSEAL FOR HEMOSTASIS MEDICATIONS: None ANESTHESIA/SEDATION: Moderate (conscious)  sedation was employed during this procedure. A total of Versed 0.5 mg and Fentanyl 25 mcg was administered intravenously. Moderate Sedation Time: 90 minutes. The patient's level of consciousness and vital signs were monitored continuously by radiology nursing throughout the procedure under my direct supervision. CONTRAST:  22mL OMNIPAQUE IOHEXOL 300 MG/ML SOLN, 75mL OMNIPAQUE IOHEXOL 300 MG/ML SOLN FLUOROSCOPY TIME:  Fluoroscopy Time: 20 minutes 30 seconds (2294 mGy). COMPLICATIONS: None PROCEDURE: Informed consent was obtained from the patient following explanation of the procedure, risks, benefits and alternatives. The patient understands, agrees and consents for the procedure. All questions were addressed. A time out was performed prior to the initiation of the procedure. Maximal barrier sterile technique utilized including caps, mask, sterile gowns, sterile gloves, large sterile drape, hand hygiene, and Betadine prep. Ultrasound survey of the right inguinal region was performed with images stored and sent to PACs, confirming patency of the vessel. A micropuncture needle was used access the right common femoral artery under ultrasound. With excellent arterial blood flow returned, and an .018 micro wire was passed through the needle, observed enter the abdominal aorta under fluoroscopy. The needle was removed, and a micropuncture sheath was placed over the wire. The inner dilator and wire were removed, and an 035 Bentson wire was advanced under fluoroscopy into the abdominal aorta. The sheath was removed and a standard 5 [place] vascular sheath was placed. The dilator was removed and the sheath was flushed. Cobra catheter was used to select the celiac artery. Angiogram was performed. Cobra catheter was used to select the SMA. Angiogram was performed, 5 cc/second times 30 cc. Glidewire was then used to navigate the catheter into the right colic artery. Angiogram was performed. Catheter was then withdrawn and a  microcatheter system was used to navigate high-flow Renegade 135 cm catheter into the ileocolic artery. Angiogram was performed. Microcatheter was withdrawn and the base catheter was withdrawn to the origin. We then selected the middle colic artery. This was performed with a combination of the high-flow Renegade catheter, 14 fathom wire, and a synchro soft. Angiogram was performed, with sub selection of the distal marginal artery from the middle colic artery. Microcatheter was withdrawn. We then again selected the right colic artery. The inferior branch of the right colic artery was then selected with the 14 wire. High-flow Renegade was advanced distally. Repeat angiogram was performed. We selected the cecal/appendiceal artery. Angiogram was performed. Catheter was withdrawn. High-flow Renegade was then advanced into the suspected distal right colic artery. Angiogram was performed. This identified the site of the hemorrhage, using multiple obliquities. A catheter exchange was then performed on a synchro exchange wire for an STC microcatheter, 150 cm length. Once the microcatheter was in the arteries of interest, we coil embolized with 2 separate 2 cm coils, 1 of 4 cm length and 1 of 6 cm length. Repeat angiogram was performed. Pull-back repeat angiogram was performed demonstrating no further blush. Catheters and wires were removed and Exoseal was placed for hemostasis. Patient tolerated the procedure well and remained hemodynamically stable throughout. No complications were encountered and no significant blood loss. IMPRESSION: Status post ultrasound guided access right common femoral artery for mesenteric angiogram and coil embolization of the Vasa recta branches of distal right colic artery as the  source of post polypectomy hemorrhage. ExoSeal deployed for hemostasis. Signed, Dulcy Fanny. Dellia Nims, RPVI Vascular and Interventional Radiology Specialists [facility] Radiology Electronically Signed   By: Corrie Mckusick  D.O.   On: 03/25/2020 13:24   IR Angiogram Selective Each Additional Vessel  Result Date: 03/25/2020 INDICATION: 74 year old female with a history polypectomy of the right colon and acute lower GI hemorrhage. She presents for angiogram and embolization. EXAM: ULTRASOUND-GUIDED ACCESS RIGHT COMMON FEMORAL ARTERY MESENTERIC ANGIOGRAM INCLUDING CELIAC ARTERY AND SMA WITH SUPER SELECTION OF SMA BRANCHES COIL EMBOLIZATION RIGHT COLIC VASA RECTA ARTERIAL BRANCHES EXOSEAL FOR HEMOSTASIS MEDICATIONS: None ANESTHESIA/SEDATION: Moderate (conscious) sedation was employed during this procedure. A total of Versed 0.5 mg and Fentanyl 25 mcg was administered intravenously. Moderate Sedation Time: 90 minutes. The patient's level of consciousness and vital signs were monitored continuously by radiology nursing throughout the procedure under my direct supervision. CONTRAST:  38mL OMNIPAQUE IOHEXOL 300 MG/ML SOLN, 89mL OMNIPAQUE IOHEXOL 300 MG/ML SOLN FLUOROSCOPY TIME:  Fluoroscopy Time: 20 minutes 30 seconds (2294 mGy). COMPLICATIONS: None PROCEDURE: Informed consent was obtained from the patient following explanation of the procedure, risks, benefits and alternatives. The patient understands, agrees and consents for the procedure. All questions were addressed. A time out was performed prior to the initiation of the procedure. Maximal barrier sterile technique utilized including caps, mask, sterile gowns, sterile gloves, large sterile drape, hand hygiene, and Betadine prep. Ultrasound survey of the right inguinal region was performed with images stored and sent to PACs, confirming patency of the vessel. A micropuncture needle was used access the right common femoral artery under ultrasound. With excellent arterial blood flow returned, and an .018 micro wire was passed through the needle, observed enter the abdominal aorta under fluoroscopy. The needle was removed, and a micropuncture sheath was placed over the wire. The inner  dilator and wire were removed, and an 035 Bentson wire was advanced under fluoroscopy into the abdominal aorta. The sheath was removed and a standard 5 [place] vascular sheath was placed. The dilator was removed and the sheath was flushed. Cobra catheter was used to select the celiac artery. Angiogram was performed. Cobra catheter was used to select the SMA. Angiogram was performed, 5 cc/second times 30 cc. Glidewire was then used to navigate the catheter into the right colic artery. Angiogram was performed. Catheter was then withdrawn and a microcatheter system was used to navigate high-flow Renegade 135 cm catheter into the ileocolic artery. Angiogram was performed. Microcatheter was withdrawn and the base catheter was withdrawn to the origin. We then selected the middle colic artery. This was performed with a combination of the high-flow Renegade catheter, 14 fathom wire, and a synchro soft. Angiogram was performed, with sub selection of the distal marginal artery from the middle colic artery. Microcatheter was withdrawn. We then again selected the right colic artery. The inferior branch of the right colic artery was then selected with the 14 wire. High-flow Renegade was advanced distally. Repeat angiogram was performed. We selected the cecal/appendiceal artery. Angiogram was performed. Catheter was withdrawn. High-flow Renegade was then advanced into the suspected distal right colic artery. Angiogram was performed. This identified the site of the hemorrhage, using multiple obliquities. A catheter exchange was then performed on a synchro exchange wire for an STC microcatheter, 150 cm length. Once the microcatheter was in the arteries of interest, we coil embolized with 2 separate 2 cm coils, 1 of 4 cm length and 1 of 6 cm length. Repeat angiogram was performed. Pull-back repeat angiogram  was performed demonstrating no further blush. Catheters and wires were removed and Exoseal was placed for hemostasis. Patient  tolerated the procedure well and remained hemodynamically stable throughout. No complications were encountered and no significant blood loss. IMPRESSION: Status post ultrasound guided access right common femoral artery for mesenteric angiogram and coil embolization of the Vasa recta branches of distal right colic artery as the source of post polypectomy hemorrhage. ExoSeal deployed for hemostasis. Signed, Dulcy Fanny. Dellia Nims, RPVI Vascular and Interventional Radiology Specialists [facility] Radiology Electronically Signed   By: Corrie Mckusick D.O.   On: 03/25/2020 13:24   IR Angiogram Selective Each Additional Vessel  Result Date: 03/25/2020 INDICATION: 74 year old female with a history polypectomy of the right colon and acute lower GI hemorrhage. She presents for angiogram and embolization. EXAM: ULTRASOUND-GUIDED ACCESS RIGHT COMMON FEMORAL ARTERY MESENTERIC ANGIOGRAM INCLUDING CELIAC ARTERY AND SMA WITH SUPER SELECTION OF SMA BRANCHES COIL EMBOLIZATION RIGHT COLIC VASA RECTA ARTERIAL BRANCHES EXOSEAL FOR HEMOSTASIS MEDICATIONS: None ANESTHESIA/SEDATION: Moderate (conscious) sedation was employed during this procedure. A total of Versed 0.5 mg and Fentanyl 25 mcg was administered intravenously. Moderate Sedation Time: 90 minutes. The patient's level of consciousness and vital signs were monitored continuously by radiology nursing throughout the procedure under my direct supervision. CONTRAST:  49mL OMNIPAQUE IOHEXOL 300 MG/ML SOLN, 7mL OMNIPAQUE IOHEXOL 300 MG/ML SOLN FLUOROSCOPY TIME:  Fluoroscopy Time: 20 minutes 30 seconds (2294 mGy). COMPLICATIONS: None PROCEDURE: Informed consent was obtained from the patient following explanation of the procedure, risks, benefits and alternatives. The patient understands, agrees and consents for the procedure. All questions were addressed. A time out was performed prior to the initiation of the procedure. Maximal barrier sterile technique utilized including caps, mask,  sterile gowns, sterile gloves, large sterile drape, hand hygiene, and Betadine prep. Ultrasound survey of the right inguinal region was performed with images stored and sent to PACs, confirming patency of the vessel. A micropuncture needle was used access the right common femoral artery under ultrasound. With excellent arterial blood flow returned, and an .018 micro wire was passed through the needle, observed enter the abdominal aorta under fluoroscopy. The needle was removed, and a micropuncture sheath was placed over the wire. The inner dilator and wire were removed, and an 035 Bentson wire was advanced under fluoroscopy into the abdominal aorta. The sheath was removed and a standard 5 [place] vascular sheath was placed. The dilator was removed and the sheath was flushed. Cobra catheter was used to select the celiac artery. Angiogram was performed. Cobra catheter was used to select the SMA. Angiogram was performed, 5 cc/second times 30 cc. Glidewire was then used to navigate the catheter into the right colic artery. Angiogram was performed. Catheter was then withdrawn and a microcatheter system was used to navigate high-flow Renegade 135 cm catheter into the ileocolic artery. Angiogram was performed. Microcatheter was withdrawn and the base catheter was withdrawn to the origin. We then selected the middle colic artery. This was performed with a combination of the high-flow Renegade catheter, 14 fathom wire, and a synchro soft. Angiogram was performed, with sub selection of the distal marginal artery from the middle colic artery. Microcatheter was withdrawn. We then again selected the right colic artery. The inferior branch of the right colic artery was then selected with the 14 wire. High-flow Renegade was advanced distally. Repeat angiogram was performed. We selected the cecal/appendiceal artery. Angiogram was performed. Catheter was withdrawn. High-flow Renegade was then advanced into the suspected distal right  colic artery. Angiogram was  performed. This identified the site of the hemorrhage, using multiple obliquities. A catheter exchange was then performed on a synchro exchange wire for an STC microcatheter, 150 cm length. Once the microcatheter was in the arteries of interest, we coil embolized with 2 separate 2 cm coils, 1 of 4 cm length and 1 of 6 cm length. Repeat angiogram was performed. Pull-back repeat angiogram was performed demonstrating no further blush. Catheters and wires were removed and Exoseal was placed for hemostasis. Patient tolerated the procedure well and remained hemodynamically stable throughout. No complications were encountered and no significant blood loss. IMPRESSION: Status post ultrasound guided access right common femoral artery for mesenteric angiogram and coil embolization of the Vasa recta branches of distal right colic artery as the source of post polypectomy hemorrhage. ExoSeal deployed for hemostasis. Signed, Dulcy Fanny. Dellia Nims, RPVI Vascular and Interventional Radiology Specialists [facility] Radiology Electronically Signed   By: Corrie Mckusick D.O.   On: 03/25/2020 13:24   IR Angiogram Selective Each Additional Vessel  Result Date: 03/25/2020 INDICATION: 74 year old female with a history polypectomy of the right colon and acute lower GI hemorrhage. She presents for angiogram and embolization. EXAM: ULTRASOUND-GUIDED ACCESS RIGHT COMMON FEMORAL ARTERY MESENTERIC ANGIOGRAM INCLUDING CELIAC ARTERY AND SMA WITH SUPER SELECTION OF SMA BRANCHES COIL EMBOLIZATION RIGHT COLIC VASA RECTA ARTERIAL BRANCHES EXOSEAL FOR HEMOSTASIS MEDICATIONS: None ANESTHESIA/SEDATION: Moderate (conscious) sedation was employed during this procedure. A total of Versed 0.5 mg and Fentanyl 25 mcg was administered intravenously. Moderate Sedation Time: 90 minutes. The patient's level of consciousness and vital signs were monitored continuously by radiology nursing throughout the procedure under my direct  supervision. CONTRAST:  69mL OMNIPAQUE IOHEXOL 300 MG/ML SOLN, 28mL OMNIPAQUE IOHEXOL 300 MG/ML SOLN FLUOROSCOPY TIME:  Fluoroscopy Time: 20 minutes 30 seconds (2294 mGy). COMPLICATIONS: None PROCEDURE: Informed consent was obtained from the patient following explanation of the procedure, risks, benefits and alternatives. The patient understands, agrees and consents for the procedure. All questions were addressed. A time out was performed prior to the initiation of the procedure. Maximal barrier sterile technique utilized including caps, mask, sterile gowns, sterile gloves, large sterile drape, hand hygiene, and Betadine prep. Ultrasound survey of the right inguinal region was performed with images stored and sent to PACs, confirming patency of the vessel. A micropuncture needle was used access the right common femoral artery under ultrasound. With excellent arterial blood flow returned, and an .018 micro wire was passed through the needle, observed enter the abdominal aorta under fluoroscopy. The needle was removed, and a micropuncture sheath was placed over the wire. The inner dilator and wire were removed, and an 035 Bentson wire was advanced under fluoroscopy into the abdominal aorta. The sheath was removed and a standard 5 [place] vascular sheath was placed. The dilator was removed and the sheath was flushed. Cobra catheter was used to select the celiac artery. Angiogram was performed. Cobra catheter was used to select the SMA. Angiogram was performed, 5 cc/second times 30 cc. Glidewire was then used to navigate the catheter into the right colic artery. Angiogram was performed. Catheter was then withdrawn and a microcatheter system was used to navigate high-flow Renegade 135 cm catheter into the ileocolic artery. Angiogram was performed. Microcatheter was withdrawn and the base catheter was withdrawn to the origin. We then selected the middle colic artery. This was performed with a combination of the high-flow  Renegade catheter, 14 fathom wire, and a synchro soft. Angiogram was performed, with sub selection of the distal marginal artery from  the middle colic artery. Microcatheter was withdrawn. We then again selected the right colic artery. The inferior branch of the right colic artery was then selected with the 14 wire. High-flow Renegade was advanced distally. Repeat angiogram was performed. We selected the cecal/appendiceal artery. Angiogram was performed. Catheter was withdrawn. High-flow Renegade was then advanced into the suspected distal right colic artery. Angiogram was performed. This identified the site of the hemorrhage, using multiple obliquities. A catheter exchange was then performed on a synchro exchange wire for an STC microcatheter, 150 cm length. Once the microcatheter was in the arteries of interest, we coil embolized with 2 separate 2 cm coils, 1 of 4 cm length and 1 of 6 cm length. Repeat angiogram was performed. Pull-back repeat angiogram was performed demonstrating no further blush. Catheters and wires were removed and Exoseal was placed for hemostasis. Patient tolerated the procedure well and remained hemodynamically stable throughout. No complications were encountered and no significant blood loss. IMPRESSION: Status post ultrasound guided access right common femoral artery for mesenteric angiogram and coil embolization of the Vasa recta branches of distal right colic artery as the source of post polypectomy hemorrhage. ExoSeal deployed for hemostasis. Signed, Dulcy Fanny. Dellia Nims, RPVI Vascular and Interventional Radiology Specialists [facility] Radiology Electronically Signed   By: Corrie Mckusick D.O.   On: 03/25/2020 13:24   IR US Guide Vasc Access Right  Result Date: 03/25/2020 INDICATION: 74 year old female with a history polypectomy of the right colon and acute lower GI hemorrhage. She presents for angiogram and embolization. EXAM: ULTRASOUND-GUIDED ACCESS RIGHT COMMON FEMORAL ARTERY  MESENTERIC ANGIOGRAM INCLUDING CELIAC ARTERY AND SMA WITH SUPER SELECTION OF SMA BRANCHES COIL EMBOLIZATION RIGHT COLIC VASA RECTA ARTERIAL BRANCHES EXOSEAL FOR HEMOSTASIS MEDICATIONS: None ANESTHESIA/SEDATION: Moderate (conscious) sedation was employed during this procedure. A total of Versed 0.5 mg and Fentanyl 25 mcg was administered intravenously. Moderate Sedation Time: 90 minutes. The patient's level of consciousness and vital signs were monitored continuously by radiology nursing throughout the procedure under my direct supervision. CONTRAST:  31mL OMNIPAQUE IOHEXOL 300 MG/ML SOLN, 39mL OMNIPAQUE IOHEXOL 300 MG/ML SOLN FLUOROSCOPY TIME:  Fluoroscopy Time: 20 minutes 30 seconds (2294 mGy). COMPLICATIONS: None PROCEDURE: Informed consent was obtained from the patient following explanation of the procedure, risks, benefits and alternatives. The patient understands, agrees and consents for the procedure. All questions were addressed. A time out was performed prior to the initiation of the procedure. Maximal barrier sterile technique utilized including caps, mask, sterile gowns, sterile gloves, large sterile drape, hand hygiene, and Betadine prep. Ultrasound survey of the right inguinal region was performed with images stored and sent to PACs, confirming patency of the vessel. A micropuncture needle was used access the right common femoral artery under ultrasound. With excellent arterial blood flow returned, and an .018 micro wire was passed through the needle, observed enter the abdominal aorta under fluoroscopy. The needle was removed, and a micropuncture sheath was placed over the wire. The inner dilator and wire were removed, and an 035 Bentson wire was advanced under fluoroscopy into the abdominal aorta. The sheath was removed and a standard 5 [place] vascular sheath was placed. The dilator was removed and the sheath was flushed. Cobra catheter was used to select the celiac artery. Angiogram was performed.  Cobra catheter was used to select the SMA. Angiogram was performed, 5 cc/second times 30 cc. Glidewire was then used to navigate the catheter into the right colic artery. Angiogram was performed. Catheter was then withdrawn and a microcatheter system was  used to navigate high-flow Renegade 135 cm catheter into the ileocolic artery. Angiogram was performed. Microcatheter was withdrawn and the base catheter was withdrawn to the origin. We then selected the middle colic artery. This was performed with a combination of the high-flow Renegade catheter, 14 fathom wire, and a synchro soft. Angiogram was performed, with sub selection of the distal marginal artery from the middle colic artery. Microcatheter was withdrawn. We then again selected the right colic artery. The inferior branch of the right colic artery was then selected with the 14 wire. High-flow Renegade was advanced distally. Repeat angiogram was performed. We selected the cecal/appendiceal artery. Angiogram was performed. Catheter was withdrawn. High-flow Renegade was then advanced into the suspected distal right colic artery. Angiogram was performed. This identified the site of the hemorrhage, using multiple obliquities. A catheter exchange was then performed on a synchro exchange wire for an STC microcatheter, 150 cm length. Once the microcatheter was in the arteries of interest, we coil embolized with 2 separate 2 cm coils, 1 of 4 cm length and 1 of 6 cm length. Repeat angiogram was performed. Pull-back repeat angiogram was performed demonstrating no further blush. Catheters and wires were removed and Exoseal was placed for hemostasis. Patient tolerated the procedure well and remained hemodynamically stable throughout. No complications were encountered and no significant blood loss. IMPRESSION: Status post ultrasound guided access right common femoral artery for mesenteric angiogram and coil embolization of the Vasa recta branches of distal right colic  artery as the source of post polypectomy hemorrhage. ExoSeal deployed for hemostasis. Signed, Dulcy Fanny. Dellia Nims, RPVI [facility] Radiology Electronically Signed   By: Corrie Mckusick D.O.   On: 03/25/2020 13:24   IR EMBO ART  VEN HEMORR LYMPH EXTRAV  INC GUIDE ROADMAPPING  Result Date: 03/25/2020 INDICATION: 74 year old female with a history polypectomy of the right colon and acute lower GI hemorrhage. She presents for angiogram and embolization. EXAM: ULTRASOUND-GUIDED ACCESS RIGHT COMMON FEMORAL ARTERY MESENTERIC ANGIOGRAM INCLUDING CELIAC ARTERY AND SMA WITH SUPER SELECTION OF SMA BRANCHES COIL EMBOLIZATION RIGHT COLIC VASA RECTA ARTERIAL BRANCHES EXOSEAL FOR HEMOSTASIS MEDICATIONS: None ANESTHESIA/SEDATION: Moderate (conscious) sedation was employed during this procedure. A total of Versed 0.5 mg and Fentanyl 25 mcg was administered intravenously. Moderate Sedation Time: 90 minutes. The patient's level of consciousness and vital signs were monitored continuously by radiology nursing throughout the procedure under my direct supervision. CONTRAST:  18mL OMNIPAQUE IOHEXOL 300 MG/ML SOLN, 84mL OMNIPAQUE IOHEXOL 300 MG/ML SOLN FLUOROSCOPY TIME:  Fluoroscopy Time: 20 minutes 30 seconds (2294 mGy). COMPLICATIONS: None PROCEDURE: Informed consent was obtained from the patient following explanation of the procedure, risks, benefits and alternatives. The patient understands, agrees and consents for the procedure. All questions were addressed. A time out was performed prior to the initiation of the procedure. Maximal barrier sterile technique utilized including caps, mask, sterile gowns, sterile gloves, large sterile drape, hand hygiene, and Betadine prep. Ultrasound survey of the right inguinal region was performed with images stored and sent to PACs, confirming patency of the vessel. A micropuncture needle was used access the right common femoral artery under  ultrasound. With excellent arterial blood flow returned, and an .018 micro wire was passed through the needle, observed enter the abdominal aorta under fluoroscopy. The needle was removed, and a micropuncture sheath was placed over the wire. The inner dilator and wire were removed, and an 035 Bentson wire was advanced under fluoroscopy into the abdominal aorta. The sheath was removed and  a standard 5 [place] vascular sheath was placed. The dilator was removed and the sheath was flushed. Cobra catheter was used to select the celiac artery. Angiogram was performed. Cobra catheter was used to select the SMA. Angiogram was performed, 5 cc/second times 30 cc. Glidewire was then used to navigate the catheter into the right colic artery. Angiogram was performed. Catheter was then withdrawn and a microcatheter system was used to navigate high-flow Renegade 135 cm catheter into the ileocolic artery. Angiogram was performed. Microcatheter was withdrawn and the base catheter was withdrawn to the origin. We then selected the middle colic artery. This was performed with a combination of the high-flow Renegade catheter, 14 fathom wire, and a synchro soft. Angiogram was performed, with sub selection of the distal marginal artery from the middle colic artery. Microcatheter was withdrawn. We then again selected the right colic artery. The inferior branch of the right colic artery was then selected with the 14 wire. High-flow Renegade was advanced distally. Repeat angiogram was performed. We selected the cecal/appendiceal artery. Angiogram was performed. Catheter was withdrawn. High-flow Renegade was then advanced into the suspected distal right colic artery. Angiogram was performed. This identified the site of the hemorrhage, using multiple obliquities. A catheter exchange was then performed on a synchro exchange wire for an STC microcatheter, 150 cm length. Once the microcatheter was in the arteries of interest, we coil embolized  with 2 separate 2 cm coils, 1 of 4 cm length and 1 of 6 cm length. Repeat angiogram was performed. Pull-back repeat angiogram was performed demonstrating no further blush. Catheters and wires were removed and Exoseal was placed for hemostasis. Patient tolerated the procedure well and remained hemodynamically stable throughout. No complications were encountered and no significant blood loss. IMPRESSION: Status post ultrasound guided access right common femoral artery for mesenteric angiogram and coil embolization of the Vasa recta branches of distal right colic artery as the source of post polypectomy hemorrhage. ExoSeal deployed for hemostasis. Signed, Dulcy Fanny. Dellia Nims, RPVI [facility] Radiology Electronically Signed   By: Corrie Mckusick D.O.   On: 03/25/2020 13:24   CT Angio Abd/Pel w/ and/or w/o  Result Date: 03/25/2020 CLINICAL DATA:  Bright red blood per rectum since midnight. Polypectomy at colonoscopy yesterday. EXAM: CTA ABDOMEN AND PELVIS WITHOUT AND WITH CONTRAST TECHNIQUE: Multidetector CT imaging of the abdomen and pelvis was performed using the standard protocol during bolus administration of intravenous contrast. Multiplanar reconstructed images and MIPs were obtained and reviewed to evaluate the vascular anatomy. CONTRAST:  195mL OMNIPAQUE IOHEXOL 350 MG/ML SOLN COMPARISON:  None. FINDINGS: VASCULAR Aorta: Atherosclerotic nonaneurysmal abdominal aorta with no dissection or stenosis. Celiac: Patent without evidence of aneurysm, dissection, vasculitis or significant stenosis. SMA: Patent without evidence of aneurysm, dissection, vasculitis or significant stenosis. Renals: Both renal arteries are patent without evidence of aneurysm, dissection, vasculitis, fibromuscular dysplasia or significant stenosis. IMA: Patent without evidence of aneurysm, dissection, vasculitis or significant stenosis. Inflow: Patent without evidence of aneurysm, dissection,  vasculitis or significant stenosis. Proximal Outflow: Bilateral common femoral and visualized portions of the superficial and profunda femoral arteries are patent without evidence of aneurysm, dissection, vasculitis or significant stenosis. Veins: No venous abnormality. Review of the MIP images confirms the above findings. NON-VASCULAR Lower chest: No significant pulmonary nodules or acute consolidative airspace disease. Right coronary atherosclerosis. Hepatobiliary: Normal liver size. No liver masses. Tiny granulomatous left liver calcifications. Cholecystectomy. No biliary ductal dilatation. Pancreas: Normal, with no mass or duct dilation. Spleen: Normal  size. No mass. Adrenals/Urinary Tract: Normal adrenals. No hydronephrosis. Scattered subcentimeter hypodense left renal cortical lesions are too small to characterize and require no follow-up. No suspicious renal masses. Normal bladder. Stomach/Bowel: Small hiatal hernia. Otherwise normal nondistended stomach. Normal caliber small bowel with no small bowel wall thickening. Normal appendix. Moderate sigmoid diverticulosis. There is streaky intraluminal hyperdensity in the cecum increasing between the arterial and venous phase sequences suspicious for active intraluminal hemorrhage (series 6/image 71 and series 12/image 43). No large bowel wall thickening. Vascular/Lymphatic: No pathologically enlarged lymph nodes in the abdomen or pelvis. Reproductive: Grossly normal uterus.  No adnexal mass. Other: No pneumoperitoneum, ascites or focal fluid collection. Musculoskeletal: No aggressive appearing focal osseous lesions. Moderate thoracolumbar spondylosis. IMPRESSION: 1. CT angiogram findings are suggestive of active intraluminal hemorrhage in the cecum. No free air. 2. Moderate sigmoid diverticulosis. 3. Small hiatal hernia. 4. Coronary atherosclerosis. 5. Aortic Atherosclerosis (ICD10-I70.0). Critical Value/emergent results were called by telephone at the time of  interpretation on 03/25/2020 at 6:59 am to provider DR. DELO, who verbally acknowledged these results. Electronically Signed   By: Ilona Sorrel M.D.   On: 03/25/2020 07:01        Scheduled Meds:  Continuous Infusions: . sodium chloride    . sodium chloride 100 mL/hr at 03/26/20 1500     LOS: 0 days    Time spent: 25 minutes.    Dana Allan, MD  [facility] Pager #: [phone] 7PM-7AM contact night coverage as above

## 2020-03-26 NOTE — Progress Notes (Signed)
2725 Pt had episode of emesis approx 174ml of yellow liquid. On call physician was paged at this time.  [place] Dr Tonie Griffith returned call and will place new orders.

## 2020-03-26 NOTE — Progress Notes (Signed)
No further active bleeding.    Did have one small bowel movement earlier, apparently looked like old blood.    Nonbloody emesis this morning noted.  Feels slightly nauseated at this time, she thinks due to headache.  Patient states her back pain is better.  Vitals are satisfactory, with blood pressure having risen progressively into a more normal range.  Systolics have been running in the 130s.  Patient is alert, lying in bed flat, no distress.  Hemoglobin was back down to 8.1, down from 9.8 yesterday afternoon which was shortly after transfusion, so I suspect this reflected equilibration.  Patient has subsequently received an additional unit of packed cells.  Impression:   1.  Successful treatment of post polypectomy hemorrhage by IR embolization 2.  No clinical evidence of further bleeding since yesterday midday. 3.  Posthemorrhagic anemia, acute, status post transfusion of at least 2 units of packed cells (I am quite sure that the patient has received 4 units total, but I can only find 2 units documented).  Plan:  1.  Clear liquids today, could advance if patient feels well and is without evidence of further bleeding 2.  Patient appears to be at low risk of further hemorrhage, so I think discharge tomorrow would be reasonable if patient remains without evidence of further bleeding 3.  I will cut back on frequency of blood draws 4.  If patient is taking oral fluids well, could cut back IV rate (currently 100 mL/h) 5.  Would recommend holding aspirin for another 2 days, and Plavix for another 5 days to be on the safe side in terms of allowing time for clot maturation  Cleotis Nipper, M.D. Pager [phone] If no answer or after 5 PM call [phone]

## 2020-03-26 NOTE — Progress Notes (Signed)
Referring Physician(s): Dr. 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 She reports some dark stools but was told by GI this was normal, no further bright red blood. No abdominal pain but is having some nausea and vomited once this morning. Feeling well otherwise, is very pleased that the procedure went well yesterday.  Allergies: Codeine and Other  Medications: Prior to Admission medications   Medication Sig Start Date End Date Taking? Authorizing Provider  aspirin EC 81 MG EC tablet Take 1 tablet (81 mg total) by mouth daily. 10/22/12  Yes Sharda, Delia Chimes, MD  atorvastatin (LIPITOR) 80 MG tablet Take 1 tablet (80 mg total) by mouth daily at 6 PM. 10/22/12  Yes Sharda, Neema K, MD  cholecalciferol (VITAMIN D) 1000 UNITS tablet Take 4,000 Units by mouth daily.   Yes [provider]  clopidogrel (PLAVIX) 75 MG tablet Take 1 tablet by mouth once daily with breakfast Patient taking differently: Take 75 mg by mouth daily.  09/28/19  Yes Josue Hector, MD  losartan-hydrochlorothiazide (HYZAAR) 50-12.5 MG tablet TAKE 1 TABLET BY MOUTH EVERY DAY Patient taking differently: Take 1 tablet by mouth daily.  08/26/19  Yes Josue Hector, MD  Magnesium 400 MG TABS Take 1 tablet by mouth daily.   Yes [provider]  metoprolol tartrate (LOPRESSOR) 50 MG tablet Take 1 tablet by mouth twice daily 07/01/19  Yes Josue Hector, MD  hydrochlorothiazide (MICROZIDE) 12.5 MG capsule Take 1 capsule (12.5 mg total) by mouth daily. Patient not taking: Reported on 03/25/2020 06/05/18   Josue Hector, MD  losartan (COZAAR) 50 MG tablet Take 1 tablet (50 mg total) by mouth daily. Patient not taking: Reported on 03/25/2020 06/05/18   Josue Hector, MD  nitroGLYCERIN (NITROSTAT) 0.4  MG SL tablet Place 1 tablet (0.4 mg total) under the tongue every 5 (five) minutes as needed for chest pain (up to 3 times). 11/12/19   Josue Hector, MD     Vital Signs: BP (!) 126/52 (BP Location: Left Arm)   Pulse 63   Temp 98 F (36.7 C) (Oral)   Resp 16   SpO2 98%   Physical Exam Vitals and nursing note reviewed.  Constitutional:      General: She is not in acute distress. HENT:     Head: Normocephalic.  Cardiovascular:     Rate and Rhythm: Normal rate.     Comments: (+) Right CFV puncture site clean, dry, dressed appropriately. Mildly tender to palpation, no active bleeding or drainage, soft.  Pulmonary:     Effort: Pulmonary effort is normal.  Abdominal:     General: There is no distension.     Palpations: Abdomen is soft.     Tenderness: There is no abdominal tenderness.  Skin:    General: Skin is warm and dry.  Neurological:     Mental Status: She is alert. Mental status is at baseline.     Imaging: IR Angiogram Visceral Selective  Result Date: 03/25/2020 INDICATION: 74 year old female with a history polypectomy of the right colon and acute lower GI hemorrhage. She presents for angiogram and embolization. EXAM: ULTRASOUND-GUIDED ACCESS RIGHT COMMON FEMORAL ARTERY MESENTERIC ANGIOGRAM INCLUDING CELIAC ARTERY AND SMA WITH SUPER SELECTION OF SMA BRANCHES COIL EMBOLIZATION RIGHT COLIC VASA  RECTA ARTERIAL BRANCHES EXOSEAL FOR HEMOSTASIS MEDICATIONS: None ANESTHESIA/SEDATION: Moderate (conscious) sedation was employed during this procedure. A total of Versed 0.5 mg and Fentanyl 25 mcg was administered intravenously. Moderate Sedation Time: 90 minutes. The patient's level of consciousness and vital signs were monitored continuously by radiology nursing throughout the procedure under my direct supervision. CONTRAST:  57mL OMNIPAQUE IOHEXOL 300 MG/ML SOLN, 59mL OMNIPAQUE IOHEXOL 300 MG/ML SOLN FLUOROSCOPY TIME:  Fluoroscopy Time: 20 minutes 30 seconds (2294 mGy). COMPLICATIONS:  None PROCEDURE: Informed consent was obtained from the patient following explanation of the procedure, risks, benefits and alternatives. The patient understands, agrees and consents for the procedure. All questions were addressed. A time out was performed prior to the initiation of the procedure. Maximal barrier sterile technique utilized including caps, mask, sterile gowns, sterile gloves, large sterile drape, hand hygiene, and Betadine prep. Ultrasound survey of the right inguinal region was performed with images stored and sent to PACs, confirming patency of the vessel. A micropuncture needle was used access the right common femoral artery under ultrasound. With excellent arterial blood flow returned, and an .018 micro wire was passed through the needle, observed enter the abdominal aorta under fluoroscopy. The needle was removed, and a micropuncture sheath was placed over the wire. The inner dilator and wire were removed, and an 035 Bentson wire was advanced under fluoroscopy into the abdominal aorta. The sheath was removed and a standard 5 [place] vascular sheath was placed. The dilator was removed and the sheath was flushed. Cobra catheter was used to select the celiac artery. Angiogram was performed. Cobra catheter was used to select the SMA. Angiogram was performed, 5 cc/second times 30 cc. Glidewire was then used to navigate the catheter into the right colic artery. Angiogram was performed. Catheter was then withdrawn and a microcatheter system was used to navigate high-flow Renegade 135 cm catheter into the ileocolic artery. Angiogram was performed. Microcatheter was withdrawn and the base catheter was withdrawn to the origin. We then selected the middle colic artery. This was performed with a combination of the high-flow Renegade catheter, 14 fathom wire, and a synchro soft. Angiogram was performed, with sub selection of the distal marginal artery from the middle colic artery. Microcatheter was withdrawn.  We then again selected the right colic artery. The inferior branch of the right colic artery was then selected with the 14 wire. High-flow Renegade was advanced distally. Repeat angiogram was performed. We selected the cecal/appendiceal artery. Angiogram was performed. Catheter was withdrawn. High-flow Renegade was then advanced into the suspected distal right colic artery. Angiogram was performed. This identified the site of the hemorrhage, using multiple obliquities. A catheter exchange was then performed on a synchro exchange wire for an STC microcatheter, 150 cm length. Once the microcatheter was in the arteries of interest, we coil embolized with 2 separate 2 cm coils, 1 of 4 cm length and 1 of 6 cm length. Repeat angiogram was performed. Pull-back repeat angiogram was performed demonstrating no further blush. Catheters and wires were removed and Exoseal was placed for hemostasis. Patient tolerated the procedure well and remained hemodynamically stable throughout. No complications were encountered and no significant blood loss. IMPRESSION: Status post ultrasound guided access right common femoral artery for mesenteric angiogram and coil embolization of the Vasa recta branches of distal right colic artery as the source of post polypectomy hemorrhage. ExoSeal deployed for hemostasis. Signed, Dulcy Fanny. Dellia Nims, [place] [facility] Radiology Electronically Signed   By: Corrie Mckusick D.O.  On: 03/25/2020 13:24   IR Angiogram Visceral Selective  Result Date: 03/25/2020 INDICATION: 74 year old female with a history polypectomy of the right colon and acute lower GI hemorrhage. She presents for angiogram and embolization. EXAM: ULTRASOUND-GUIDED ACCESS RIGHT COMMON FEMORAL ARTERY MESENTERIC ANGIOGRAM INCLUDING CELIAC ARTERY AND SMA WITH SUPER SELECTION OF SMA BRANCHES COIL EMBOLIZATION RIGHT COLIC VASA RECTA ARTERIAL BRANCHES EXOSEAL FOR HEMOSTASIS MEDICATIONS: None  ANESTHESIA/SEDATION: Moderate (conscious) sedation was employed during this procedure. A total of Versed 0.5 mg and Fentanyl 25 mcg was administered intravenously. Moderate Sedation Time: 90 minutes. The patient's level of consciousness and vital signs were monitored continuously by radiology nursing throughout the procedure under my direct supervision. CONTRAST:  42mL OMNIPAQUE IOHEXOL 300 MG/ML SOLN, 53mL OMNIPAQUE IOHEXOL 300 MG/ML SOLN FLUOROSCOPY TIME:  Fluoroscopy Time: 20 minutes 30 seconds (2294 mGy). COMPLICATIONS: None PROCEDURE: Informed consent was obtained from the patient following explanation of the procedure, risks, benefits and alternatives. The patient understands, agrees and consents for the procedure. All questions were addressed. A time out was performed prior to the initiation of the procedure. Maximal barrier sterile technique utilized including caps, mask, sterile gowns, sterile gloves, large sterile drape, hand hygiene, and Betadine prep. Ultrasound survey of the right inguinal region was performed with images stored and sent to PACs, confirming patency of the vessel. A micropuncture needle was used access the right common femoral artery under ultrasound. With excellent arterial blood flow returned, and an .018 micro wire was passed through the needle, observed enter the abdominal aorta under fluoroscopy. The needle was removed, and a micropuncture sheath was placed over the wire. The inner dilator and wire were removed, and an 035 Bentson wire was advanced under fluoroscopy into the abdominal aorta. The sheath was removed and a standard 5 [place] vascular sheath was placed. The dilator was removed and the sheath was flushed. Cobra catheter was used to select the celiac artery. Angiogram was performed. Cobra catheter was used to select the SMA. Angiogram was performed, 5 cc/second times 30 cc. Glidewire was then used to navigate the catheter into the right colic artery. Angiogram was  performed. Catheter was then withdrawn and a microcatheter system was used to navigate high-flow Renegade 135 cm catheter into the ileocolic artery. Angiogram was performed. Microcatheter was withdrawn and the base catheter was withdrawn to the origin. We then selected the middle colic artery. This was performed with a combination of the high-flow Renegade catheter, 14 fathom wire, and a synchro soft. Angiogram was performed, with sub selection of the distal marginal artery from the middle colic artery. Microcatheter was withdrawn. We then again selected the right colic artery. The inferior branch of the right colic artery was then selected with the 14 wire. High-flow Renegade was advanced distally. Repeat angiogram was performed. We selected the cecal/appendiceal artery. Angiogram was performed. Catheter was withdrawn. High-flow Renegade was then advanced into the suspected distal right colic artery. Angiogram was performed. This identified the site of the hemorrhage, using multiple obliquities. A catheter exchange was then performed on a synchro exchange wire for an STC microcatheter, 150 cm length. Once the microcatheter was in the arteries of interest, we coil embolized with 2 separate 2 cm coils, 1 of 4 cm length and 1 of 6 cm length. Repeat angiogram was performed. Pull-back repeat angiogram was performed demonstrating no further blush. Catheters and wires were removed and Exoseal was placed for hemostasis. Patient tolerated the procedure well and remained hemodynamically stable throughout. No complications were encountered and no significant blood  loss. IMPRESSION: Status post ultrasound guided access right common femoral artery for mesenteric angiogram and coil embolization of the Vasa recta branches of distal right colic artery as the source of post polypectomy hemorrhage. ExoSeal deployed for hemostasis. Signed, Dulcy Fanny. Dellia Nims, [facility]  Radiology Electronically Signed   By: Corrie Mckusick D.O.   On: 03/25/2020 13:24   IR Angiogram Selective Each Additional Vessel  Result Date: 03/25/2020 INDICATION: 74 year old female with a history polypectomy of the right colon and acute lower GI hemorrhage. She presents for angiogram and embolization. EXAM: ULTRASOUND-GUIDED ACCESS RIGHT COMMON FEMORAL ARTERY MESENTERIC ANGIOGRAM INCLUDING CELIAC ARTERY AND SMA WITH SUPER SELECTION OF SMA BRANCHES COIL EMBOLIZATION RIGHT COLIC VASA RECTA ARTERIAL BRANCHES EXOSEAL FOR HEMOSTASIS MEDICATIONS: None ANESTHESIA/SEDATION: Moderate (conscious) sedation was employed during this procedure. A total of Versed 0.5 mg and Fentanyl 25 mcg was administered intravenously. Moderate Sedation Time: 90 minutes. The patient's level of consciousness and vital signs were monitored continuously by radiology nursing throughout the procedure under my direct supervision. CONTRAST:  64mL OMNIPAQUE IOHEXOL 300 MG/ML SOLN, 75mL OMNIPAQUE IOHEXOL 300 MG/ML SOLN FLUOROSCOPY TIME:  Fluoroscopy Time: 20 minutes 30 seconds (2294 mGy). COMPLICATIONS: None PROCEDURE: Informed consent was obtained from the patient following explanation of the procedure, risks, benefits and alternatives. The patient understands, agrees and consents for the procedure. All questions were addressed. A time out was performed prior to the initiation of the procedure. Maximal barrier sterile technique utilized including caps, mask, sterile gowns, sterile gloves, large sterile drape, hand hygiene, and Betadine prep. Ultrasound survey of the right inguinal region was performed with images stored and sent to PACs, confirming patency of the vessel. A micropuncture needle was used access the right common femoral artery under ultrasound. With excellent arterial blood flow returned, and an .018 micro wire was passed through the needle, observed enter the abdominal aorta under fluoroscopy. The needle was removed, and a  micropuncture sheath was placed over the wire. The inner dilator and wire were removed, and an 035 Bentson wire was advanced under fluoroscopy into the abdominal aorta. The sheath was removed and a standard 5 [place] vascular sheath was placed. The dilator was removed and the sheath was flushed. Cobra catheter was used to select the celiac artery. Angiogram was performed. Cobra catheter was used to select the SMA. Angiogram was performed, 5 cc/second times 30 cc. Glidewire was then used to navigate the catheter into the right colic artery. Angiogram was performed. Catheter was then withdrawn and a microcatheter system was used to navigate high-flow Renegade 135 cm catheter into the ileocolic artery. Angiogram was performed. Microcatheter was withdrawn and the base catheter was withdrawn to the origin. We then selected the middle colic artery. This was performed with a combination of the high-flow Renegade catheter, 14 fathom wire, and a synchro soft. Angiogram was performed, with sub selection of the distal marginal artery from the middle colic artery. Microcatheter was withdrawn. We then again selected the right colic artery. The inferior branch of the right colic artery was then selected with the 14 wire. High-flow Renegade was advanced distally. Repeat angiogram was performed. We selected the cecal/appendiceal artery. Angiogram was performed. Catheter was withdrawn. High-flow Renegade was then advanced into the suspected distal right colic artery. Angiogram was performed. This identified the site of the hemorrhage, using multiple obliquities. A catheter exchange was then performed on a synchro exchange wire for an STC microcatheter, 150 cm length. Once the microcatheter was in the arteries  of interest, we coil embolized with 2 separate 2 cm coils, 1 of 4 cm length and 1 of 6 cm length. Repeat angiogram was performed. Pull-back repeat angiogram was performed demonstrating no further blush. Catheters and wires were  removed and Exoseal was placed for hemostasis. Patient tolerated the procedure well and remained hemodynamically stable throughout. No complications were encountered and no significant blood loss. IMPRESSION: Status post ultrasound guided access right common femoral artery for mesenteric angiogram and coil embolization of the Vasa recta branches of distal right colic artery as the source of post polypectomy hemorrhage. ExoSeal deployed for hemostasis. Signed, Dulcy Fanny. Dellia Nims, [facility] Radiology Electronically Signed   By: Corrie Mckusick D.O.   On: 03/25/2020 13:24   IR Angiogram Selective Each Additional Vessel  Result Date: 03/25/2020 INDICATION: 74 year old female with a history polypectomy of the right colon and acute lower GI hemorrhage. She presents for angiogram and embolization. EXAM: ULTRASOUND-GUIDED ACCESS RIGHT COMMON FEMORAL ARTERY MESENTERIC ANGIOGRAM INCLUDING CELIAC ARTERY AND SMA WITH SUPER SELECTION OF SMA BRANCHES COIL EMBOLIZATION RIGHT COLIC VASA RECTA ARTERIAL BRANCHES EXOSEAL FOR HEMOSTASIS MEDICATIONS: None ANESTHESIA/SEDATION: Moderate (conscious) sedation was employed during this procedure. A total of Versed 0.5 mg and Fentanyl 25 mcg was administered intravenously. Moderate Sedation Time: 90 minutes. The patient's level of consciousness and vital signs were monitored continuously by radiology nursing throughout the procedure under my direct supervision. CONTRAST:  40mL OMNIPAQUE IOHEXOL 300 MG/ML SOLN, 68mL OMNIPAQUE IOHEXOL 300 MG/ML SOLN FLUOROSCOPY TIME:  Fluoroscopy Time: 20 minutes 30 seconds (2294 mGy). COMPLICATIONS: None PROCEDURE: Informed consent was obtained from the patient following explanation of the procedure, risks, benefits and alternatives. The patient understands, agrees and consents for the procedure. All questions were addressed. A time out was performed prior to the initiation of the procedure. Maximal  barrier sterile technique utilized including caps, mask, sterile gowns, sterile gloves, large sterile drape, hand hygiene, and Betadine prep. Ultrasound survey of the right inguinal region was performed with images stored and sent to PACs, confirming patency of the vessel. A micropuncture needle was used access the right common femoral artery under ultrasound. With excellent arterial blood flow returned, and an .018 micro wire was passed through the needle, observed enter the abdominal aorta under fluoroscopy. The needle was removed, and a micropuncture sheath was placed over the wire. The inner dilator and wire were removed, and an 035 Bentson wire was advanced under fluoroscopy into the abdominal aorta. The sheath was removed and a standard 5 [place] vascular sheath was placed. The dilator was removed and the sheath was flushed. Cobra catheter was used to select the celiac artery. Angiogram was performed. Cobra catheter was used to select the SMA. Angiogram was performed, 5 cc/second times 30 cc. Glidewire was then used to navigate the catheter into the right colic artery. Angiogram was performed. Catheter was then withdrawn and a microcatheter system was used to navigate high-flow Renegade 135 cm catheter into the ileocolic artery. Angiogram was performed. Microcatheter was withdrawn and the base catheter was withdrawn to the origin. We then selected the middle colic artery. This was performed with a combination of the high-flow Renegade catheter, 14 fathom wire, and a synchro soft. Angiogram was performed, with sub selection of the distal marginal artery from the middle colic artery. Microcatheter was withdrawn. We then again selected the right colic artery. The inferior branch of the right colic artery was then selected with the 14 wire. High-flow Renegade was advanced distally. Repeat  angiogram was performed. We selected the cecal/appendiceal artery. Angiogram was performed. Catheter was withdrawn. High-flow  Renegade was then advanced into the suspected distal right colic artery. Angiogram was performed. This identified the site of the hemorrhage, using multiple obliquities. A catheter exchange was then performed on a synchro exchange wire for an STC microcatheter, 150 cm length. Once the microcatheter was in the arteries of interest, we coil embolized with 2 separate 2 cm coils, 1 of 4 cm length and 1 of 6 cm length. Repeat angiogram was performed. Pull-back repeat angiogram was performed demonstrating no further blush. Catheters and wires were removed and Exoseal was placed for hemostasis. Patient tolerated the procedure well and remained hemodynamically stable throughout. No complications were encountered and no significant blood loss. IMPRESSION: Status post ultrasound guided access right common femoral artery for mesenteric angiogram and coil embolization of the Vasa recta branches of distal right colic artery as the source of post polypectomy hemorrhage. ExoSeal deployed for hemostasis. Signed, Dulcy Fanny. Dellia Nims, [facility] Radiology Electronically Signed   By: Corrie Mckusick D.O.   On: 03/25/2020 13:24   IR Angiogram Selective Each Additional Vessel  Result Date: 03/25/2020 INDICATION: 74 year old female with a history polypectomy of the right colon and acute lower GI hemorrhage. She presents for angiogram and embolization. EXAM: ULTRASOUND-GUIDED ACCESS RIGHT COMMON FEMORAL ARTERY MESENTERIC ANGIOGRAM INCLUDING CELIAC ARTERY AND SMA WITH SUPER SELECTION OF SMA BRANCHES COIL EMBOLIZATION RIGHT COLIC VASA RECTA ARTERIAL BRANCHES EXOSEAL FOR HEMOSTASIS MEDICATIONS: None ANESTHESIA/SEDATION: Moderate (conscious) sedation was employed during this procedure. A total of Versed 0.5 mg and Fentanyl 25 mcg was administered intravenously. Moderate Sedation Time: 90 minutes. The patient's level of consciousness and vital signs were monitored continuously by  radiology nursing throughout the procedure under my direct supervision. CONTRAST:  39mL OMNIPAQUE IOHEXOL 300 MG/ML SOLN, 44mL OMNIPAQUE IOHEXOL 300 MG/ML SOLN FLUOROSCOPY TIME:  Fluoroscopy Time: 20 minutes 30 seconds (2294 mGy). COMPLICATIONS: None PROCEDURE: Informed consent was obtained from the patient following explanation of the procedure, risks, benefits and alternatives. The patient understands, agrees and consents for the procedure. All questions were addressed. A time out was performed prior to the initiation of the procedure. Maximal barrier sterile technique utilized including caps, mask, sterile gowns, sterile gloves, large sterile drape, hand hygiene, and Betadine prep. Ultrasound survey of the right inguinal region was performed with images stored and sent to PACs, confirming patency of the vessel. A micropuncture needle was used access the right common femoral artery under ultrasound. With excellent arterial blood flow returned, and an .018 micro wire was passed through the needle, observed enter the abdominal aorta under fluoroscopy. The needle was removed, and a micropuncture sheath was placed over the wire. The inner dilator and wire were removed, and an 035 Bentson wire was advanced under fluoroscopy into the abdominal aorta. The sheath was removed and a standard 5 [place] vascular sheath was placed. The dilator was removed and the sheath was flushed. Cobra catheter was used to select the celiac artery. Angiogram was performed. Cobra catheter was used to select the SMA. Angiogram was performed, 5 cc/second times 30 cc. Glidewire was then used to navigate the catheter into the right colic artery. Angiogram was performed. Catheter was then withdrawn and a microcatheter system was used to navigate high-flow Renegade 135 cm catheter into the ileocolic artery. Angiogram was performed. Microcatheter was withdrawn and the base catheter was withdrawn to the origin. We then selected the middle colic  artery. This  was performed with a combination of the high-flow Renegade catheter, 14 fathom wire, and a synchro soft. Angiogram was performed, with sub selection of the distal marginal artery from the middle colic artery. Microcatheter was withdrawn. We then again selected the right colic artery. The inferior branch of the right colic artery was then selected with the 14 wire. High-flow Renegade was advanced distally. Repeat angiogram was performed. We selected the cecal/appendiceal artery. Angiogram was performed. Catheter was withdrawn. High-flow Renegade was then advanced into the suspected distal right colic artery. Angiogram was performed. This identified the site of the hemorrhage, using multiple obliquities. A catheter exchange was then performed on a synchro exchange wire for an STC microcatheter, 150 cm length. Once the microcatheter was in the arteries of interest, we coil embolized with 2 separate 2 cm coils, 1 of 4 cm length and 1 of 6 cm length. Repeat angiogram was performed. Pull-back repeat angiogram was performed demonstrating no further blush. Catheters and wires were removed and Exoseal was placed for hemostasis. Patient tolerated the procedure well and remained hemodynamically stable throughout. No complications were encountered and no significant blood loss. IMPRESSION: Status post ultrasound guided access right common femoral artery for mesenteric angiogram and coil embolization of the Vasa recta branches of distal right colic artery as the source of post polypectomy hemorrhage. ExoSeal deployed for hemostasis. Signed, Dulcy Fanny. Dellia Nims, RPVI [facility] Radiology Electronically Signed   By: Corrie Mckusick D.O.   On: 03/25/2020 13:24   IR Angiogram Selective Each Additional Vessel  Result Date: 03/25/2020 INDICATION: 74 year old female with a history polypectomy of the right colon and acute lower GI hemorrhage. She presents for angiogram and  embolization. EXAM: ULTRASOUND-GUIDED ACCESS RIGHT COMMON FEMORAL ARTERY MESENTERIC ANGIOGRAM INCLUDING CELIAC ARTERY AND SMA WITH SUPER SELECTION OF SMA BRANCHES COIL EMBOLIZATION RIGHT COLIC VASA RECTA ARTERIAL BRANCHES EXOSEAL FOR HEMOSTASIS MEDICATIONS: None ANESTHESIA/SEDATION: Moderate (conscious) sedation was employed during this procedure. A total of Versed 0.5 mg and Fentanyl 25 mcg was administered intravenously. Moderate Sedation Time: 90 minutes. The patient's level of consciousness and vital signs were monitored continuously by radiology nursing throughout the procedure under my direct supervision. CONTRAST:  20mL OMNIPAQUE IOHEXOL 300 MG/ML SOLN, 56mL OMNIPAQUE IOHEXOL 300 MG/ML SOLN FLUOROSCOPY TIME:  Fluoroscopy Time: 20 minutes 30 seconds (2294 mGy). COMPLICATIONS: None PROCEDURE: Informed consent was obtained from the patient following explanation of the procedure, risks, benefits and alternatives. The patient understands, agrees and consents for the procedure. All questions were addressed. A time out was performed prior to the initiation of the procedure. Maximal barrier sterile technique utilized including caps, mask, sterile gowns, sterile gloves, large sterile drape, hand hygiene, and Betadine prep. Ultrasound survey of the right inguinal region was performed with images stored and sent to PACs, confirming patency of the vessel. A micropuncture needle was used access the right common femoral artery under ultrasound. With excellent arterial blood flow returned, and an .018 micro wire was passed through the needle, observed enter the abdominal aorta under fluoroscopy. The needle was removed, and a micropuncture sheath was placed over the wire. The inner dilator and wire were removed, and an 035 Bentson wire was advanced under fluoroscopy into the abdominal aorta. The sheath was removed and a standard 5 [place] vascular sheath was placed. The dilator was removed and the sheath was flushed. Cobra  catheter was used to select the celiac artery. Angiogram was performed. Cobra catheter was used to select the SMA. Angiogram was performed, 5  cc/second times 30 cc. Glidewire was then used to navigate the catheter into the right colic artery. Angiogram was performed. Catheter was then withdrawn and a microcatheter system was used to navigate high-flow Renegade 135 cm catheter into the ileocolic artery. Angiogram was performed. Microcatheter was withdrawn and the base catheter was withdrawn to the origin. We then selected the middle colic artery. This was performed with a combination of the high-flow Renegade catheter, 14 fathom wire, and a synchro soft. Angiogram was performed, with sub selection of the distal marginal artery from the middle colic artery. Microcatheter was withdrawn. We then again selected the right colic artery. The inferior branch of the right colic artery was then selected with the 14 wire. High-flow Renegade was advanced distally. Repeat angiogram was performed. We selected the cecal/appendiceal artery. Angiogram was performed. Catheter was withdrawn. High-flow Renegade was then advanced into the suspected distal right colic artery. Angiogram was performed. This identified the site of the hemorrhage, using multiple obliquities. A catheter exchange was then performed on a synchro exchange wire for an STC microcatheter, 150 cm length. Once the microcatheter was in the arteries of interest, we coil embolized with 2 separate 2 cm coils, 1 of 4 cm length and 1 of 6 cm length. Repeat angiogram was performed. Pull-back repeat angiogram was performed demonstrating no further blush. Catheters and wires were removed and Exoseal was placed for hemostasis. Patient tolerated the procedure well and remained hemodynamically stable throughout. No complications were encountered and no significant blood loss. IMPRESSION: Status post ultrasound guided access right common femoral artery for mesenteric angiogram and  coil embolization of the Vasa recta branches of distal right colic artery as the source of post polypectomy hemorrhage. ExoSeal deployed for hemostasis. Signed, Dulcy Fanny. Dellia Nims, RPVI V[facility] Interventional Radiology Specialists [facility] Radiology Electronically Signed   By: Corrie Mckusick D.O.   On: 03/25/2020 13:24   IR Angiogram Selective Each Additional Vessel  Result Date: 03/25/2020 INDICATION: 74 year old female with a history polypectomy of the right colon and acute lower GI hemorrhage. She presents for angiogram and embolization. EXAM: ULTRASOUND-GUIDED ACCESS RIGHT COMMON FEMORAL ARTERY MESENTERIC ANGIOGRAM INCLUDING CELIAC ARTERY AND SMA WITH SUPER SELECTION OF SMA BRANCHES COIL EMBOLIZATION RIGHT COLIC VASA RECTA ARTERIAL BRANCHES EXOSEAL FOR HEMOSTASIS MEDICATIONS: None ANESTHESIA/SEDATION: Moderate (conscious) sedation was employed during this procedure. A total of Versed 0.5 mg and Fentanyl 25 mcg was administered intravenously. Moderate Sedation Time: 90 minutes. The patient's level of consciousness and vital signs were monitored continuously by radiology nursing throughout the procedure under my direct supervision. CONTRAST:  68mL OMNIPAQUE IOHEXOL 300 MG/ML SOLN, 18mL OMNIPAQUE IOHEXOL 300 MG/ML SOLN FLUOROSCOPY TIME:  Fluoroscopy Time: 20 minutes 30 seconds (2294 mGy). COMPLICATIONS: None PROCEDURE: Informed consent was obtained from the patient following explanation of the procedure, risks, benefits and alternatives. The patient understands, agrees and consents for the procedure. All questions were addressed. A time out was performed prior to the initiation of the procedure. Maximal barrier sterile technique utilized including caps, mask, sterile gowns, sterile gloves, large sterile drape, hand hygiene, and Betadine prep. Ultrasound survey of the right inguinal region was performed with images stored and sent to PACs, confirming patency of the vessel. A micropuncture needle was used  access the right common femoral artery under ultrasound. With excellent arterial blood flow returned, and an .018 micro wire was passed through the needle, observed enter the abdominal aorta under fluoroscopy. The needle was removed, and a micropuncture sheath was placed over the wire. The inner  dilator and wire were removed, and an 035 Bentson wire was advanced under fluoroscopy into the abdominal aorta. The sheath was removed and a standard 5 [place] vascular sheath was placed. The dilator was removed and the sheath was flushed. Cobra catheter was used to select the celiac artery. Angiogram was performed. Cobra catheter was used to select the SMA. Angiogram was performed, 5 cc/second times 30 cc. Glidewire was then used to navigate the catheter into the right colic artery. Angiogram was performed. Catheter was then withdrawn and a microcatheter system was used to navigate high-flow Renegade 135 cm catheter into the ileocolic artery. Angiogram was performed. Microcatheter was withdrawn and the base catheter was withdrawn to the origin. We then selected the middle colic artery. This was performed with a combination of the high-flow Renegade catheter, 14 fathom wire, and a synchro soft. Angiogram was performed, with sub selection of the distal marginal artery from the middle colic artery. Microcatheter was withdrawn. We then again selected the right colic artery. The inferior branch of the right colic artery was then selected with the 14 wire. High-flow Renegade was advanced distally. Repeat angiogram was performed. We selected the cecal/appendiceal artery. Angiogram was performed. Catheter was withdrawn. High-flow Renegade was then advanced into the suspected distal right colic artery. Angiogram was performed. This identified the site of the hemorrhage, using multiple obliquities. A catheter exchange was then performed on a synchro exchange wire for an STC microcatheter, 150 cm length. Once the microcatheter was in  the arteries of interest, we coil embolized with 2 separate 2 cm coils, 1 of 4 cm length and 1 of 6 cm length. Repeat angiogram was performed. Pull-back repeat angiogram was performed demonstrating no further blush. Catheters and wires were removed and Exoseal was placed for hemostasis. Patient tolerated the procedure well and remained hemodynamically stable throughout. No complications were encountered and no significant blood loss. IMPRESSION: Status post ultrasound guided access right common femoral artery for mesenteric angiogram and coil embolization of the Vasa recta branches of distal right colic artery as the source of post polypectomy hemorrhage. ExoSeal deployed for hemostasis. Signed, Dulcy Fanny. Dellia Nims, RPVI Vascular and Interventional Radiology Specialists [facility] Radiology Electronically Signed   By: Corrie Mckusick D.O.   On: 03/25/2020 13:24   IR Angiogram Selective Each Additional Vessel  Result Date: 03/25/2020 INDICATION: 74 year old female with a history polypectomy of the right colon and acute lower GI hemorrhage. She presents for angiogram and embolization. EXAM: ULTRASOUND-GUIDED ACCESS RIGHT COMMON FEMORAL ARTERY MESENTERIC ANGIOGRAM INCLUDING CELIAC ARTERY AND SMA WITH SUPER SELECTION OF SMA BRANCHES COIL EMBOLIZATION RIGHT COLIC VASA RECTA ARTERIAL BRANCHES EXOSEAL FOR HEMOSTASIS MEDICATIONS: None ANESTHESIA/SEDATION: Moderate (conscious) sedation was employed during this procedure. A total of Versed 0.5 mg and Fentanyl 25 mcg was administered intravenously. Moderate Sedation Time: 90 minutes. The patient's level of consciousness and vital signs were monitored continuously by radiology nursing throughout the procedure under my direct supervision. CONTRAST:  81mL OMNIPAQUE IOHEXOL 300 MG/ML SOLN, 42mL OMNIPAQUE IOHEXOL 300 MG/ML SOLN FLUOROSCOPY TIME:  Fluoroscopy Time: 20 minutes 30 seconds (2294 mGy). COMPLICATIONS: None PROCEDURE: Informed consent was obtained from the patient  following explanation of the procedure, risks, benefits and alternatives. The patient understands, agrees and consents for the procedure. All questions were addressed. A time out was performed prior to the initiation of the procedure. Maximal barrier sterile technique utilized including caps, mask, sterile gowns, sterile gloves, large sterile drape, hand hygiene, and Betadine prep. Ultrasound survey of the right inguinal region was  performed with images stored and sent to PACs, confirming patency of the vessel. A micropuncture needle was used access the right common femoral artery under ultrasound. With excellent arterial blood flow returned, and an .018 micro wire was passed through the needle, observed enter the abdominal aorta under fluoroscopy. The needle was removed, and a micropuncture sheath was placed over the wire. The inner dilator and wire were removed, and an 035 Bentson wire was advanced under fluoroscopy into the abdominal aorta. The sheath was removed and a standard 5 [place] vascular sheath was placed. The dilator was removed and the sheath was flushed. Cobra catheter was used to select the celiac artery. Angiogram was performed. Cobra catheter was used to select the SMA. Angiogram was performed, 5 cc/second times 30 cc. Glidewire was then used to navigate the catheter into the right colic artery. Angiogram was performed. Catheter was then withdrawn and a microcatheter system was used to navigate high-flow Renegade 135 cm catheter into the ileocolic artery. Angiogram was performed. Microcatheter was withdrawn and the base catheter was withdrawn to the origin. We then selected the middle colic artery. This was performed with a combination of the high-flow Renegade catheter, 14 fathom wire, and a synchro soft. Angiogram was performed, with sub selection of the distal marginal artery from the middle colic artery. Microcatheter was withdrawn. We then again selected the right colic artery. The inferior  branch of the right colic artery was then selected with the 14 wire. High-flow Renegade was advanced distally. Repeat angiogram was performed. We selected the cecal/appendiceal artery. Angiogram was performed. Catheter was withdrawn. High-flow Renegade was then advanced into the suspected distal right colic artery. Angiogram was performed. This identified the site of the hemorrhage, using multiple obliquities. A catheter exchange was then performed on a synchro exchange wire for an STC microcatheter, 150 cm length. Once the microcatheter was in the arteries of interest, we coil embolized with 2 separate 2 cm coils, 1 of 4 cm length and 1 of 6 cm length. Repeat angiogram was performed. Pull-back repeat angiogram was performed demonstrating no further blush. Catheters and wires were removed and Exoseal was placed for hemostasis. Patient tolerated the procedure well and remained hemodynamically stable throughout. No complications were encountered and no significant blood loss. IMPRESSION: Status post ultrasound guided access right common femoral artery for mesenteric angiogram and coil embolization of the Vasa recta branches of distal right colic artery as the source of post polypectomy hemorrhage. ExoSeal deployed for hemostasis. Signed, Dulcy Fanny. Dellia Nims, RPVI Vascular and Interventional Radiology Specialists [facility] Radiology Electronically Signed   By: Corrie Mckusick D.O.   On: 03/25/2020 13:24   IR US Guide Vasc Access Right  Result Date: 03/25/2020 INDICATION: 74 year old female with a history polypectomy of the right colon and acute lower GI hemorrhage. She presents for angiogram and embolization. EXAM: ULTRASOUND-GUIDED ACCESS RIGHT COMMON FEMORAL ARTERY MESENTERIC ANGIOGRAM INCLUDING CELIAC ARTERY AND SMA WITH SUPER SELECTION OF SMA BRANCHES COIL EMBOLIZATION RIGHT COLIC VASA RECTA ARTERIAL BRANCHES EXOSEAL FOR HEMOSTASIS MEDICATIONS: None ANESTHESIA/SEDATION: Moderate (conscious) sedation was employed  during this procedure. A total of Versed 0.5 mg and Fentanyl 25 mcg was administered intravenously. Moderate Sedation Time: 90 minutes. The patient's level of consciousness and vital signs were monitored continuously by radiology nursing throughout the procedure under my direct supervision. CONTRAST:  94mL OMNIPAQUE IOHEXOL 300 MG/ML SOLN, 10mL OMNIPAQUE IOHEXOL 300 MG/ML SOLN FLUOROSCOPY TIME:  Fluoroscopy Time: 20 minutes 30 seconds (2294 mGy). COMPLICATIONS: None PROCEDURE: Informed consent was obtained from  the patient following explanation of the procedure, risks, benefits and alternatives. The patient understands, agrees and consents for the procedure. All questions were addressed. A time out was performed prior to the initiation of the procedure. Maximal barrier sterile technique utilized including caps, mask, sterile gowns, sterile gloves, large sterile drape, hand hygiene, and Betadine prep. Ultrasound survey of the right inguinal region was performed with images stored and sent to PACs, confirming patency of the vessel. A micropuncture needle was used access the right common femoral artery under ultrasound. With excellent arterial blood flow returned, and an .018 micro wire was passed through the needle, observed enter the abdominal aorta under fluoroscopy. The needle was removed, and a micropuncture sheath was placed over the wire. The inner dilator and wire were removed, and an 035 Bentson wire was advanced under fluoroscopy into the abdominal aorta. The sheath was removed and a standard 5 [place] vascular sheath was placed. The dilator was removed and the sheath was flushed. Cobra catheter was used to select the celiac artery. Angiogram was performed. Cobra catheter was used to select the SMA. Angiogram was performed, 5 cc/second times 30 cc. Glidewire was then used to navigate the catheter into the right colic artery. Angiogram was performed. Catheter was then withdrawn and a microcatheter system was  used to navigate high-flow Renegade 135 cm catheter into the ileocolic artery. Angiogram was performed. Microcatheter was withdrawn and the base catheter was withdrawn to the origin. We then selected the middle colic artery. This was performed with a combination of the high-flow Renegade catheter, 14 fathom wire, and a synchro soft. Angiogram was performed, with sub selection of the distal marginal artery from the middle colic artery. Microcatheter was withdrawn. We then again selected the right colic artery. The inferior branch of the right colic artery was then selected with the 14 wire. High-flow Renegade was advanced distally. Repeat angiogram was performed. We selected the cecal/appendiceal artery. Angiogram was performed. Catheter was withdrawn. High-flow Renegade was then advanced into the suspected distal right colic artery. Angiogram was performed. This identified the site of the hemorrhage, using multiple obliquities. A catheter exchange was then performed on a synchro exchange wire for an STC microcatheter, 150 cm length. Once the microcatheter was in the arteries of interest, we coil embolized with 2 separate 2 cm coils, 1 of 4 cm length and 1 of 6 cm length. Repeat angiogram was performed. Pull-back repeat angiogram was performed demonstrating no further blush. Catheters and wires were removed and Exoseal was placed for hemostasis. Patient tolerated the procedure well and remained hemodynamically stable throughout. No complications were encountered and no significant blood loss. IMPRESSION: Status post ultrasound guided access right common femoral artery for mesenteric angiogram and coil embolization of the Vasa recta branches of distal right colic artery as the source of post polypectomy hemorrhage. ExoSeal deployed for hemostasis. Signed, Dulcy Fanny. Dellia Nims, RPVI Vascular and Interventional Radiology Specialists [facility] Radiology Electronically Signed   By: Corrie Mckusick D.O.   On: 03/25/2020  13:24   IR EMBO ART  VEN HEMORR LYMPH EXTRAV  INC GUIDE ROADMAPPING  Result Date: 03/25/2020 INDICATION: 74 year old female with a history polypectomy of the right colon and acute lower GI hemorrhage. She presents for angiogram and embolization. EXAM: ULTRASOUND-GUIDED ACCESS RIGHT COMMON FEMORAL ARTERY MESENTERIC ANGIOGRAM INCLUDING CELIAC ARTERY AND SMA WITH SUPER SELECTION OF SMA BRANCHES COIL EMBOLIZATION RIGHT COLIC VASA RECTA ARTERIAL BRANCHES EXOSEAL FOR HEMOSTASIS MEDICATIONS: None ANESTHESIA/SEDATION: Moderate (conscious) sedation was employed during this procedure. A total  of Versed 0.5 mg and Fentanyl 25 mcg was administered intravenously. Moderate Sedation Time: 90 minutes. The patient's level of consciousness and vital signs were monitored continuously by radiology nursing throughout the procedure under my direct supervision. CONTRAST:  57mL OMNIPAQUE IOHEXOL 300 MG/ML SOLN, 1mL OMNIPAQUE IOHEXOL 300 MG/ML SOLN FLUOROSCOPY TIME:  Fluoroscopy Time: 20 minutes 30 seconds (2294 mGy). COMPLICATIONS: None PROCEDURE: Informed consent was obtained from the patient following explanation of the procedure, risks, benefits and alternatives. The patient understands, agrees and consents for the procedure. All questions were addressed. A time out was performed prior to the initiation of the procedure. Maximal barrier sterile technique utilized including caps, mask, sterile gowns, sterile gloves, large sterile drape, hand hygiene, and Betadine prep. Ultrasound survey of the right inguinal region was performed with images stored and sent to PACs, confirming patency of the vessel. A micropuncture needle was used access the right common femoral artery under ultrasound. With excellent arterial blood flow returned, and an .018 micro wire was passed through the needle, observed enter the abdominal aorta under fluoroscopy. The needle was removed, and a micropuncture sheath was placed over the wire. The inner dilator and  wire were removed, and an 035 Bentson wire was advanced under fluoroscopy into the abdominal aorta. The sheath was removed and a standard 5 [place] vascular sheath was placed. The dilator was removed and the sheath was flushed. Cobra catheter was used to select the celiac artery. Angiogram was performed. Cobra catheter was used to select the SMA. Angiogram was performed, 5 cc/second times 30 cc. Glidewire was then used to navigate the catheter into the right colic artery. Angiogram was performed. Catheter was then withdrawn and a microcatheter system was used to navigate high-flow Renegade 135 cm catheter into the ileocolic artery. Angiogram was performed. Microcatheter was withdrawn and the base catheter was withdrawn to the origin. We then selected the middle colic artery. This was performed with a combination of the high-flow Renegade catheter, 14 fathom wire, and a synchro soft. Angiogram was performed, with sub selection of the distal marginal artery from the middle colic artery. Microcatheter was withdrawn. We then again selected the right colic artery. The inferior branch of the right colic artery was then selected with the 14 wire. High-flow Renegade was advanced distally. Repeat angiogram was performed. We selected the cecal/appendiceal artery. Angiogram was performed. Catheter was withdrawn. High-flow Renegade was then advanced into the suspected distal right colic artery. Angiogram was performed. This identified the site of the hemorrhage, using multiple obliquities. A catheter exchange was then performed on a synchro exchange wire for an STC microcatheter, 150 cm length. Once the microcatheter was in the arteries of interest, we coil embolized with 2 separate 2 cm coils, 1 of 4 cm length and 1 of 6 cm length. Repeat angiogram was performed. Pull-back repeat angiogram was performed demonstrating no further blush. Catheters and wires were removed and Exoseal was placed for hemostasis. Patient tolerated the  procedure well and remained hemodynamically stable throughout. No complications were encountered and no significant blood loss. IMPRESSION: Status post ultrasound guided access right common femoral artery for mesenteric angiogram and coil embolization of the Vasa recta branches of distal right colic artery as the source of post polypectomy hemorrhage. ExoSeal deployed for hemostasis. Signed, Dulcy Fanny. Dellia Nims, RPVI Vascular and Interventional Radiology Specialists [facility] Radiology Electronically Signed   By: Corrie Mckusick D.O.   On: 03/25/2020 13:24   CT Angio Abd/Pel w/ and/or w/o  Result Date: 03/25/2020 CLINICAL DATA:  Bright red blood per rectum since midnight. Polypectomy at colonoscopy yesterday. EXAM: CTA ABDOMEN AND PELVIS WITHOUT AND WITH CONTRAST TECHNIQUE: Multidetector CT imaging of the abdomen and pelvis was performed using the standard protocol during bolus administration of intravenous contrast. Multiplanar reconstructed images and MIPs were obtained and reviewed to evaluate the vascular anatomy. CONTRAST:  133mL OMNIPAQUE IOHEXOL 350 MG/ML SOLN COMPARISON:  None. FINDINGS: VASCULAR Aorta: Atherosclerotic nonaneurysmal abdominal aorta with no dissection or stenosis. Celiac: Patent without evidence of aneurysm, dissection, vasculitis or significant stenosis. SMA: Patent without evidence of aneurysm, dissection, vasculitis or significant stenosis. Renals: Both renal arteries are patent without evidence of aneurysm, dissection, vasculitis, fibromuscular dysplasia or significant stenosis. IMA: Patent without evidence of aneurysm, dissection, vasculitis or significant stenosis. Inflow: Patent without evidence of aneurysm, dissection, vasculitis or significant stenosis. Proximal Outflow: Bilateral common femoral and visualized portions of the superficial and profunda femoral arteries are patent without evidence of aneurysm, dissection, vasculitis or significant stenosis. Veins: No venous  abnormality. Review of the MIP images confirms the above findings. NON-VASCULAR Lower chest: No significant pulmonary nodules or acute consolidative airspace disease. Right coronary atherosclerosis. Hepatobiliary: Normal liver size. No liver masses. Tiny granulomatous left liver calcifications. Cholecystectomy. No biliary ductal dilatation. Pancreas: Normal, with no mass or duct dilation. Spleen: Normal size. No mass. Adrenals/Urinary Tract: Normal adrenals. No hydronephrosis. Scattered subcentimeter hypodense left renal cortical lesions are too small to characterize and require no follow-up. No suspicious renal masses. Normal bladder. Stomach/Bowel: Small hiatal hernia. Otherwise normal nondistended stomach. Normal caliber small bowel with no small bowel wall thickening. Normal appendix. Moderate sigmoid diverticulosis. There is streaky intraluminal hyperdensity in the cecum increasing between the arterial and venous phase sequences suspicious for active intraluminal hemorrhage (series 6/image 71 and series 12/image 43). No large bowel wall thickening. Vascular/Lymphatic: No pathologically enlarged lymph nodes in the abdomen or pelvis. Reproductive: Grossly normal uterus.  No adnexal mass. Other: No pneumoperitoneum, ascites or focal fluid collection. Musculoskeletal: No aggressive appearing focal osseous lesions. Moderate thoracolumbar spondylosis. IMPRESSION: 1. CT angiogram findings are suggestive of active intraluminal hemorrhage in the cecum. No free air. 2. Moderate sigmoid diverticulosis. 3. Small hiatal hernia. 4. Coronary atherosclerosis. 5. Aortic Atherosclerosis (ICD10-I70.0). Critical Value/emergent results were called by telephone at the time of interpretation on 03/25/2020 at 6:59 am to provider DR. DELO, who verbally acknowledged these results. Electronically Signed   By: Ilona Sorrel M.D.   On: 03/25/2020 07:01    Labs:  CBC: Recent Labs    03/25/20 0130 03/25/20 0130 03/25/20 0354  03/25/20 0627 03/25/20 1513 03/26/20 0010  WBC 13.4*  --   --   --  13.5* 11.2*  HGB 12.7   < > 10.7* 8.1* 9.8* 8.1*  HCT 38.2   < > 31.9* 25.4* 29.9* 24.7*  PLT 290  --   --   --  170 178   < > = values in this interval not displayed.    COAGS: No results for input(s): INR, APTT in the last 8760 hours.  BMP: Recent Labs    03/25/20 0130 03/26/20 0010  NA 142 141  K 3.8 2.9*  CL 107 114*  CO2 26 21*  GLUCOSE 134* 99  BUN 15 8  CALCIUM 9.3 7.2*  CREATININE 0.74 0.52  GFRNONAA >60 >60  GFRAA >60 >60    LIVER FUNCTION TESTS: Recent Labs    03/25/20 0130 03/26/20 0010  BILITOT 0.9  --   AST 28  --   ALT 18  --  ALKPHOS 39  --   PROT 6.0*  --   ALBUMIN 3.8 2.4*    Assessment and Plan:  74 y/o F who presented to the ED yesterday with post polypectomy bleeding s/p successful embolization of vasa recta branches from right colic artery yesterday in IR seen today for post procedure site check.  Continues to have dark stools but no further bright red blood per rectum. No abdominal pain but does have some n/v. Right CFV puncture site clean, dry, dressed appropriately without active bleeding/drainage. Appropriately tender to palpation.  Routine wound care of puncture site until healed, do not submerge x 7 days.  Further plans per primary team/GI - IR remains available as needed.  Electronically Signed: Joaquim Nam, PA-C 03/26/2020, 12:27 PM   I spent a total of 15 Minutes at the the patient's bedside AND on the patient's hospital floor or unit, greater than 50% of which was counseling/coordinating care for GI bleed embolization follow up.

## 2020-03-27 ENCOUNTER — Encounter (HOSPITAL_COMMUNITY): Payer: Self-pay

## 2020-03-27 LAB — RENAL FUNCTION PANEL
Albumin: 2.5 g/dL — ABNORMAL LOW (ref 3.5–5.0)
Anion gap: 6 (ref 5–15)
BUN: 6 mg/dL — ABNORMAL LOW (ref 8–23)
CO2: 24 mmol/L (ref 22–32)
Calcium: 7.8 mg/dL — ABNORMAL LOW (ref 8.9–10.3)
Chloride: 108 mmol/L (ref 98–111)
Creatinine, Ser: 0.46 mg/dL (ref 0.44–1.00)
GFR calc Af Amer: >60 mL/min (ref >60)
GFR calc non Af Amer: >60 mL/min (ref >60)
Glucose, Bld: 89 mg/dL (ref 70–99)
Phosphorus: 1.9 mg/dL — ABNORMAL LOW (ref 2.5–4.6)
Potassium: 3.4 mmol/L — ABNORMAL LOW (ref 3.5–5.1)
Sodium: 138 mmol/L (ref 135–145)

## 2020-03-27 LAB — CBC
HCT: 21.4 % — ABNORMAL LOW (ref 36.0–46.0)
Hemoglobin: 7.1 g/dL — ABNORMAL LOW (ref 12.0–15.0)
MCH: 30 pg (ref 26.0–34.0)
MCHC: 33.2 g/dL (ref 30.0–36.0)
MCV: 90.3 fL (ref 80.0–100.0)
Platelets: 171 10*3/uL (ref 150–400)
RBC: 2.37 MIL/uL — ABNORMAL LOW (ref 3.87–5.11)
RDW: 14.6 % (ref 11.5–15.5)
WBC: 8.9 10*3/uL (ref 4.0–10.5)
nRBC: 0 % (ref 0.0–0.2)

## 2020-03-27 LAB — HEMOGLOBIN AND HEMATOCRIT, BLOOD
HCT: 24 % — ABNORMAL LOW (ref 36.0–46.0)
Hemoglobin: 7.8 g/dL — ABNORMAL LOW (ref 12.0–15.0)

## 2020-03-27 LAB — MAGNESIUM: Magnesium: 1.9 mg/dL (ref 1.7–2.4)

## 2020-03-27 MED ORDER — ASPIRIN 81 MG PO TBEC: 81.0000 mg | DELAYED_RELEASE_TABLET | Freq: Every day | ORAL | 1 refills | Status: AC

## 2020-03-27 MED ORDER — CLOPIDOGREL BISULFATE 75 MG PO TABS
75.0000 mg | ORAL_TABLET | Freq: Every day | ORAL | 1 refills | Status: DC
Start: 2020-03-31 — End: 2022-02-06

## 2020-03-27 MED ORDER — LOSARTAN POTASSIUM 50 MG PO TABS
50.0000 mg | ORAL_TABLET | Freq: Every day | ORAL | 1 refills | Status: DC
Start: 2020-03-27 — End: 2020-05-30

## 2020-03-27 NOTE — Plan of Care (Signed)
  Problem: Education: Goal: Knowledge of General Education information will improve Description: Including pain rating scale, medication(s)/side effects and non-pharmacologic comfort measures Outcome: Adequate for Discharge   

## 2020-03-27 NOTE — Discharge Summary (Signed)
Physician Discharge Summary  [name] MRN: [id number] DOB/AGE: 1946-03-22 74 y.o.  Admit date: 03/25/2020 Discharge date: 03/27/2020  Admission Diagnoses:  Discharge Diagnoses:  Principal Problem:   GI bleed Active Problems:   HTN (hypertension)   CAD (coronary artery disease)   Acute blood loss anemia   Discharged Condition: stable  Hospital Course:  Patient is a 74 year old female with past medical history significant for CADwith stents,on aspirin and Plavix; hypertension and hyperlipidemia.  Patient developed bright red blood per rectum following colonoscopy and polypectomy done earlier on the day of admission.  Bleeding was significant.  On presentation to the hospital, hemoglobin had dropped from 12.7 to 10.7 g/dL on same day.  Repeat hemoglobin was 8.1 g/dL the following.  CT of the abdomen and pelvis with and without contrast was suggestive of active intraluminal hemorrhage at the cecal area.  Moderate sigmoid diverticulosis was also reported.  GI team, Dr. Ronald Lobo, directed patient's care.  Interventional radiology team was consulted and patient underwent angiography and embolization for active extravasation from right colic artery vasa recta.  Patient had another episode of rectal bleed after the embolization.  Patient has remained stable.  H&H has remained stable.  Patient has been cleared for discharge by the GI team.  The plan is for patient to resume aspirin tomorrow, and Plavix for 4 days.  Patient will follow with a primary care provider and GI team on discharge.    Acute GI bleed: -Patient presenting with multiple episodes of rectal bleeding status post polypectomy on 03/24/2020. -Patient presented with significant rectal bleed (bright red per rectum). -Hemoglobin had dropped from 12 to 8 g/dL within 24 hours. -Intermittent episodes of hypotension.   -Patient was volume resuscitated.  Patient was transfused with 2 units of blood. -CT scan of the  abdomen and pelvis revealed active bleeding. -Interventional radiology was consulted. -Patient underwent mesenteric angiogram and embolization of a bleeding vessel. -H/H has remained stable.  Patient has been cleared for discharge by GI team.  CAD -Resume aspirin tomorrow (03/28/2020). -Resume Plavix in 4 days (03/31/2020) (.  Hypertension -We will restart patient's beta-blocker. -We will start patient on losartan 50 mg p.o. once daily.   -PCP to optimize blood pressure control.  Consults: - GI  -Interventional radiology team.  Significant Diagnostic Studies:  -US guided right CFA access and Mesenteric angiogram.  -Embolization of vasa recta branches from right colic artery distribution Exoseal deployed  Findings: Active extrav from right colic artery vasa recta, embolized.   Treatments: -Patient was volume resuscitated and transfused with packed red blood cells.  Discharge Exam: Blood pressure (!) 137/51, pulse 80, temperature 98.2 F (36.8 C), temperature source Oral, resp. rate (!) 26, SpO2 97 %.   Disposition: Discharge disposition: 01-Home or Self Care  Discharge Instructions    Diet - low sodium heart healthy   Complete by: As directed    Increase activity slowly   Complete by: As directed    No wound care   Complete by: As directed      Allergies as of 03/27/2020      Reactions   Codeine Nausea And Vomiting   Other    Sensitive to pain and anesthesia medications      Medication List    STOP taking these medications   hydrochlorothiazide 12.5 MG capsule Commonly known as: MICROZIDE   losartan-hydrochlorothiazide 50-12.5 MG tablet Commonly known as: HYZAAR     TAKE these medications   aspirin 81 MG EC tablet Take  1 tablet (81 mg total) by mouth daily. Start taking on: March 28, 2020   atorvastatin 80 MG tablet Commonly known as: LIPITOR Take 1 tablet (80 mg total) by mouth daily at 6 PM.   cholecalciferol 1000 units tablet Commonly known  as: VITAMIN D Take 4,000 Units by mouth daily.   clopidogrel 75 MG tablet Commonly known as: PLAVIX Take 1 tablet (75 mg total) by mouth daily. Start taking on: March 31, 2020 What changed:   See the new instructions.  These instructions start on March 31, 2020. If you are unsure what to do until then, ask your doctor or other care provider.   losartan 50 MG tablet Commonly known as: COZAAR Take 1 tablet (50 mg total) by mouth daily.   Magnesium 400 MG Tabs Take 1 tablet by mouth daily.   metoprolol tartrate 50 MG tablet Commonly known as: LOPRESSOR Take 1 tablet by mouth twice daily   nitroGLYCERIN 0.4 MG SL tablet Commonly known as: NITROSTAT Place 1 tablet (0.4 mg total) under the tongue every 5 (five) minutes as needed for chest pain (up to 3 times).        SignedBonnell Public 03/27/2020, 9:47 AM

## 2020-03-27 NOTE — Progress Notes (Signed)
Discharge instructions (including medications) discussed with and copy provided to patient/caregiver 

## 2020-03-28 DIAGNOSIS — D122 Benign neoplasm of ascending colon: Secondary | ICD-10-CM | POA: Diagnosis not present

## 2020-03-28 DIAGNOSIS — D123 Benign neoplasm of transverse colon: Secondary | ICD-10-CM | POA: Diagnosis not present

## 2020-03-28 DIAGNOSIS — D120 Benign neoplasm of cecum: Secondary | ICD-10-CM | POA: Diagnosis not present

## 2020-03-28 DIAGNOSIS — D124 Benign neoplasm of descending colon: Secondary | ICD-10-CM | POA: Diagnosis not present

## 2020-03-28 DIAGNOSIS — D125 Benign neoplasm of sigmoid colon: Secondary | ICD-10-CM | POA: Diagnosis not present

## 2020-03-28 LAB — BPAM RBC
Blood Product Expiration Date: 202110312359
Blood Product Expiration Date: 202110312359
Blood Product Expiration Date: 202110312359
Blood Product Expiration Date: 202111012359
ISSUE DATE / TIME: 202110020627
ISSUE DATE / TIME: 202110020627
Unit Type and Rh: 5100
Unit Type and Rh: 5100
Unit Type and Rh: 5100
Unit Type and Rh: 5100

## 2020-03-28 LAB — TYPE AND SCREEN
ABO/RH(D): O POS
Antibody Screen: NEGATIVE
Unit division: 0
Unit division: 0
Unit division: 0
Unit division: 0

## 2020-04-04 DIAGNOSIS — D62 Acute posthemorrhagic anemia: Secondary | ICD-10-CM | POA: Diagnosis not present

## 2020-04-05 DIAGNOSIS — E7800 Pure hypercholesterolemia, unspecified: Secondary | ICD-10-CM | POA: Diagnosis not present

## 2020-04-05 DIAGNOSIS — I10 Essential (primary) hypertension: Secondary | ICD-10-CM | POA: Diagnosis not present

## 2020-04-05 DIAGNOSIS — I2510 Atherosclerotic heart disease of native coronary artery without angina pectoris: Secondary | ICD-10-CM | POA: Diagnosis not present

## 2020-04-19 DIAGNOSIS — N3001 Acute cystitis with hematuria: Secondary | ICD-10-CM | POA: Diagnosis not present

## 2020-04-19 DIAGNOSIS — S60051D Contusion of right little finger without damage to nail, subsequent encounter: Secondary | ICD-10-CM | POA: Diagnosis not present

## 2020-04-19 DIAGNOSIS — R319 Hematuria, unspecified: Secondary | ICD-10-CM | POA: Diagnosis not present

## 2020-05-05 DIAGNOSIS — D508 Other iron deficiency anemias: Secondary | ICD-10-CM | POA: Diagnosis not present

## 2020-05-05 DIAGNOSIS — N3001 Acute cystitis with hematuria: Secondary | ICD-10-CM | POA: Diagnosis not present

## 2020-05-24 ENCOUNTER — Other Ambulatory Visit: Payer: Self-pay | Admitting: Cardiovascular Disease

## 2020-05-26 NOTE — Progress Notes (Signed)
[name], female   DOB: 1945/10/28, 74 y.o.   MRN: [id number]     74 y.o.  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  [name] is Ob doctor in  [place] played football at [employer] I also take care of her husband   Previous smoker   Myovue 12/02/19 normal non ischemic EF 60%   Had significant bleed post colonoscopy 03/25/20 requiring transfusion and IR embolization of the vasa recta branches of the distal right colic artery after polypectomy   Husband Cassandra Castillo in urgent care with chronic leg wound   ROS: Denies fever, malais, weight loss, blurry vision, decreased visual acuity, cough, sputum, SOB, hemoptysis, pleuritic pain, palpitaitons, heartburn, abdominal pain, melena, lower extremity edema, claudication, or rash.  All other systems reviewed and negative  General: BP 140/70   Pulse 69   Ht 5\' 4"  (1.626 m)   Wt 84.4 kg   SpO2 95%   BMI 31.93 kg/m  Affect appropriate Overweight white female  HEENT: poor dentition  Neck supple with no adenopathy JVP normal no bruits no thyromegaly Lungs clear with no wheezing and good diaphragmatic motion Heart:  S1/S2 no murmur, no rub, gallop or click PMI normal Abdomen: benighn, BS positve, no tenderness, no AAA no bruit.  No HSM or HJR Distal pulses intact with no bruits No edema Neuro non-focal Skin warm and dry No muscular weakness    Current Outpatient Medications  Medication Sig Dispense Refill  . aspirin 81 MG EC tablet Take 1 tablet (81 mg total) by mouth daily. 30 tablet 1  . atorvastatin (LIPITOR) 80 MG tablet Take 1 tablet (80 mg total) by mouth daily at 6 PM. 30 tablet 1  . cholecalciferol (VITAMIN D) 1000 UNITS tablet Take 4,000 Units by mouth daily.    . clopidogrel (PLAVIX) 75 MG tablet Take 1 tablet (75 mg total) by mouth daily. 30 tablet 1   . losartan-hydrochlorothiazide (HYZAAR) 50-12.5 MG tablet Take 1 tablet by mouth daily.    . Magnesium 400 MG TABS Take 1 tablet by mouth daily.    . metoprolol tartrate (LOPRESSOR) 50 MG tablet Take 1 tablet by mouth twice daily 180 tablet 3  . Multiple Vitamins-Minerals (CENTRUM SILVER PO) Take 1 tablet by mouth daily.    . nitroGLYCERIN (NITROSTAT) 0.4 MG SL tablet Place 1 tablet (0.4 mg total) under the tongue every 5 (five) minutes as needed for chest pain (up to 3 times). 25 tablet 2   No current facility-administered medications for this visit.    Allergies  Codeine and Other  Electrocardiogram:   04/22/18 SR rate 59 low voltage normal  05/06/19 SR rate 50 normal   Assessment and Plan  HTN:  Improved with ARB resume her diuretic that was held with GI bleed   CAD: Had RCA stenting mid/distal vessel 2014 Angina is atypical  Myovue normal 12/02/19 EF 60% no ischemia Continue medical Rx d/c plavix    Chol:  On statin labs with Dr Cassandra Castillo   GI:   To have colonoscopy with Dr Cassandra Castillo

## 2020-05-30 ENCOUNTER — Other Ambulatory Visit: Payer: Self-pay

## 2020-05-30 ENCOUNTER — Ambulatory Visit (INDEPENDENT_AMBULATORY_CARE_PROVIDER_SITE_OTHER): Payer: Medicare Other | Admitting: Cardiovascular Disease

## 2020-05-30 ENCOUNTER — Encounter: Payer: Self-pay | Admitting: Cardiovascular Disease

## 2020-05-30 VITALS — BP 140/70 | HR 69 | Ht 64.0 in | Wt 186.0 lb

## 2020-05-30 DIAGNOSIS — I2510 Atherosclerotic heart disease of native coronary artery without angina pectoris: Secondary | ICD-10-CM | POA: Diagnosis not present

## 2020-05-30 DIAGNOSIS — I10 Essential (primary) hypertension: Secondary | ICD-10-CM

## 2020-05-30 NOTE — Patient Instructions (Signed)
Medication Instructions:  *If you need a refill on your cardiac medications before your next appointment, please call your pharmacy*  Lab Work: If you have labs (blood work) drawn today and your tests are completely normal, you will receive your results only by: . MyChart Message (if you have MyChart) OR . A paper copy in the mail If you have any lab test that is abnormal or we need to change your treatment, we will call you to review the results.  Follow-Up: At [facility], you and your health needs are our priority.  As part of our continuing mission to provide you with exceptional heart care, we have created designated Provider Care Teams.  These Care Teams include your primary Cardiologist (physician) and Advanced Practice Providers (APPs -  Physician Assistants and Nurse Practitioners) who all work together to provide you with the care you need, when you need it.  We recommend signing up for the patient portal called "MyChart".  Sign up information is provided on this After Visit Summary.  MyChart is used to connect with patients for Virtual Visits (Telemedicine).  Patients are able to view lab/test results, encounter notes, upcoming appointments, etc.  Non-urgent messages can be sent to your provider as well.   To learn more about what you can do with MyChart, go to [email or web address].    Your next appointment:   6 month(s)  The format for your next appointment:   In Person  Provider:   You may see Peter Nishan, MD or one of the following Advanced Practice Providers on your designated Care Team:    Lori Gerhardt, NP  Laura Ingold, NP  Jill McDaniel, NP    

## 2020-06-24 HISTORY — PX: COLONOSCOPY: SHX174

## 2020-07-02 ENCOUNTER — Other Ambulatory Visit: Payer: Self-pay | Admitting: Cardiovascular Disease

## 2020-07-17 DIAGNOSIS — M13841 Other specified arthritis, right hand: Secondary | ICD-10-CM | POA: Diagnosis not present

## 2020-07-21 DIAGNOSIS — H25013 Cortical age-related cataract, bilateral: Secondary | ICD-10-CM | POA: Diagnosis not present

## 2020-07-21 DIAGNOSIS — H40013 Open angle with borderline findings, low risk, bilateral: Secondary | ICD-10-CM | POA: Diagnosis not present

## 2020-07-21 DIAGNOSIS — H35363 Drusen (degenerative) of macula, bilateral: Secondary | ICD-10-CM | POA: Diagnosis not present

## 2020-07-21 DIAGNOSIS — H2513 Age-related nuclear cataract, bilateral: Secondary | ICD-10-CM | POA: Diagnosis not present

## 2020-07-27 ENCOUNTER — Other Ambulatory Visit: Payer: Self-pay | Admitting: Cardiovascular Disease

## 2020-08-23 DIAGNOSIS — R310 Gross hematuria: Secondary | ICD-10-CM | POA: Diagnosis not present

## 2020-09-05 ENCOUNTER — Other Ambulatory Visit: Payer: Self-pay | Admitting: Family Medicine

## 2020-09-05 DIAGNOSIS — Z1231 Encounter for screening mammogram for malignant neoplasm of breast: Secondary | ICD-10-CM

## 2020-09-05 DIAGNOSIS — I10 Essential (primary) hypertension: Secondary | ICD-10-CM | POA: Diagnosis not present

## 2020-09-05 DIAGNOSIS — Z0000 Encounter for general adult medical examination without abnormal findings: Secondary | ICD-10-CM | POA: Diagnosis not present

## 2020-09-05 DIAGNOSIS — N029 Recurrent and persistent hematuria with unspecified morphologic changes: Secondary | ICD-10-CM | POA: Diagnosis not present

## 2020-09-05 DIAGNOSIS — D508 Other iron deficiency anemias: Secondary | ICD-10-CM | POA: Diagnosis not present

## 2020-09-05 DIAGNOSIS — E559 Vitamin D deficiency, unspecified: Secondary | ICD-10-CM | POA: Diagnosis not present

## 2020-09-05 DIAGNOSIS — I2510 Atherosclerotic heart disease of native coronary artery without angina pectoris: Secondary | ICD-10-CM | POA: Diagnosis not present

## 2020-09-05 DIAGNOSIS — E7800 Pure hypercholesterolemia, unspecified: Secondary | ICD-10-CM | POA: Diagnosis not present

## 2020-09-13 DIAGNOSIS — K5730 Diverticulosis of large intestine without perforation or abscess without bleeding: Secondary | ICD-10-CM | POA: Diagnosis not present

## 2020-09-13 DIAGNOSIS — R310 Gross hematuria: Secondary | ICD-10-CM | POA: Diagnosis not present

## 2020-09-19 DIAGNOSIS — R310 Gross hematuria: Secondary | ICD-10-CM | POA: Diagnosis not present

## 2020-10-18 DIAGNOSIS — I2510 Atherosclerotic heart disease of native coronary artery without angina pectoris: Secondary | ICD-10-CM | POA: Diagnosis not present

## 2020-10-18 DIAGNOSIS — D508 Other iron deficiency anemias: Secondary | ICD-10-CM | POA: Diagnosis not present

## 2020-10-18 DIAGNOSIS — N029 Recurrent and persistent hematuria with unspecified morphologic changes: Secondary | ICD-10-CM | POA: Diagnosis not present

## 2020-10-18 DIAGNOSIS — E7800 Pure hypercholesterolemia, unspecified: Secondary | ICD-10-CM | POA: Diagnosis not present

## 2020-10-18 DIAGNOSIS — I10 Essential (primary) hypertension: Secondary | ICD-10-CM | POA: Diagnosis not present

## 2020-10-30 DIAGNOSIS — I10 Essential (primary) hypertension: Secondary | ICD-10-CM | POA: Diagnosis not present

## 2020-10-30 DIAGNOSIS — E7800 Pure hypercholesterolemia, unspecified: Secondary | ICD-10-CM | POA: Diagnosis not present

## 2020-10-30 DIAGNOSIS — I2510 Atherosclerotic heart disease of native coronary artery without angina pectoris: Secondary | ICD-10-CM | POA: Diagnosis not present

## 2020-10-30 DIAGNOSIS — N029 Recurrent and persistent hematuria with unspecified morphologic changes: Secondary | ICD-10-CM | POA: Diagnosis not present

## 2020-10-30 DIAGNOSIS — D508 Other iron deficiency anemias: Secondary | ICD-10-CM | POA: Diagnosis not present

## 2020-11-01 ENCOUNTER — Ambulatory Visit
Admission: RE | Admit: 2020-11-01 | Discharge: 2020-11-01 | Disposition: A | Discharge status: Home / Self Care | Payer: Medicare Other | Source: Ambulatory Visit | Attending: Family Medicine | Admitting: Family Medicine

## 2020-11-01 ENCOUNTER — Other Ambulatory Visit: Payer: Self-pay

## 2020-11-01 DIAGNOSIS — Z1231 Encounter for screening mammogram for malignant neoplasm of breast: Secondary | ICD-10-CM | POA: Diagnosis not present

## 2020-12-05 NOTE — Progress Notes (Signed)
[name], female   DOB: 07/31/1945, 75 y.o.   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  [name] is OB/GYN doc in town. She has quit smoking Normal myovue in June 2021 EF 60%   Had significant bleed post colonoscopy 03/25/20 requiring transfusion and IR embolization of the vasa recta branches of the distal right colic artery after polypectomy   She likes to travel a lot Doing 20,000 steps/day or about 10 miles   She wants to try lower dose of beta blocker to get her HR in "fat burning" mode   ROS: Denies fever, malais, weight loss, blurry vision, decreased visual acuity, cough, sputum, SOB, hemoptysis, pleuritic pain, palpitaitons, heartburn, abdominal pain, melena, lower extremity edema, claudication, or rash.  All other systems reviewed and negative  General: BP 138/70   Pulse (!) 52   Ht 5\' 4"  (1.626 m)   Wt 81.2 kg   SpO2 98%   BMI 30.73 kg/m  Affect appropriate Overweight white female  HEENT: poor dentition  Neck supple with no adenopathy JVP normal no bruits no thyromegaly Lungs clear with no wheezing and good diaphragmatic motion Heart:  S1/S2 no murmur, no rub, gallop or click PMI normal Abdomen: benighn, BS positve, no tenderness, no AAA no bruit.  No HSM or HJR Distal pulses intact with no bruits No edema Neuro non-focal Skin warm and dry No muscular weakness    Current Outpatient Medications  Medication Sig Dispense Refill   aspirin 81 MG EC tablet Take 1 tablet (81 mg total) by mouth daily. 30 tablet 1   atorvastatin (LIPITOR) 80 MG tablet Take 1 tablet (80 mg total) by mouth daily at 6 PM. 30 tablet 1   cholecalciferol (VITAMIN D) 1000 UNITS tablet Take 4,000 Units by mouth daily.     clopidogrel (PLAVIX) 75 MG tablet Take 1 tablet (75 mg total) by mouth daily. 30 tablet 1   losartan-hydrochlorothiazide (HYZAAR)  50-12.5 MG tablet TAKE 1 TABLET BY MOUTH EVERY DAY 90 tablet 3   Magnesium 400 MG TABS Take 1 tablet by mouth daily.     metoprolol tartrate (LOPRESSOR) 50 MG tablet Take 1 tablet by mouth twice daily 180 tablet 3   Multiple Vitamins-Minerals (CENTRUM SILVER PO) Take 1 tablet by mouth daily.     nitroGLYCERIN (NITROSTAT) 0.4 MG SL tablet Place 1 tablet (0.4 mg total) under the tongue every 5 (five) minutes as needed for chest pain (up to 3 times). 25 tablet 2   No current facility-administered medications for this visit.    Allergies  Codeine and Other  Electrocardiogram:   04/22/18 SR rate 59 low voltage normal  05/06/19 SR rate 50 normal   Assessment and Plan  HTN:  Improved with ARB rand diuretic low sodium DASH type diet  CAD: Had RCA stenting mid/distal vessel 2014 Angina is atypical  Myovue normal 12/02/19 EF 60% no ischemia Continue medical Rx Off DAT now    Chol:  On statin labs with Cassandra Castillo   GI:   F/U colonoscopy with Cassandra Castillo   F/U in a year    Cassandra Castillo

## 2020-12-12 ENCOUNTER — Ambulatory Visit (INDEPENDENT_AMBULATORY_CARE_PROVIDER_SITE_OTHER): Payer: Medicare Other | Admitting: Cardiovascular Disease

## 2020-12-12 ENCOUNTER — Encounter: Payer: Self-pay | Admitting: Cardiovascular Disease

## 2020-12-12 ENCOUNTER — Other Ambulatory Visit: Payer: Self-pay

## 2020-12-12 VITALS — BP 138/70 | HR 52 | Ht 64.0 in | Wt 179.0 lb

## 2020-12-12 DIAGNOSIS — I10 Essential (primary) hypertension: Secondary | ICD-10-CM

## 2020-12-12 DIAGNOSIS — E782 Mixed hyperlipidemia: Secondary | ICD-10-CM

## 2020-12-12 DIAGNOSIS — I2510 Atherosclerotic heart disease of native coronary artery without angina pectoris: Secondary | ICD-10-CM

## 2020-12-12 NOTE — Patient Instructions (Signed)
Medication Instructions:  *If you need a refill on your cardiac medications before your next appointment, please call your pharmacy*  Lab Work: If you have labs (blood work) drawn today and your tests are completely normal, you will receive your results only by: [place] (if you have MyChart) OR A paper copy in the mail If you have any lab test that is abnormal or we need to change your treatment, we will call you to review the results.   Follow-Up: At [facility], you and your health needs are our priority.  As part of our continuing mission to provide you with exceptional heart care, we have created designated Provider Care Teams.  These Care Teams include your primary Cardiologist (physician) and Advanced Practice Providers (APPs -  Physician Assistants and Nurse Practitioners) who all work together to provide you with the care you need, when you need it.  We recommend signing up for the patient portal called "MyChart".  Sign up information is provided on this After Visit Summary.  MyChart is used to connect with patients for Virtual Visits (Telemedicine).  Patients are able to view lab/test results, encounter notes, upcoming appointments, etc.  Non-urgent messages can be sent to your provider as well.   To learn more about what you can do with MyChart, go to [email or web address].    Your next appointment:   12 month(s)  The format for your next appointment:   In Person  Provider:   You may see Jenkins Rouge, MD or one of the following Advanced Practice Providers on your designated Care Team:   Kathyrn Drown, NP

## 2021-02-02 DIAGNOSIS — I2510 Atherosclerotic heart disease of native coronary artery without angina pectoris: Secondary | ICD-10-CM | POA: Diagnosis not present

## 2021-02-02 DIAGNOSIS — I10 Essential (primary) hypertension: Secondary | ICD-10-CM | POA: Diagnosis not present

## 2021-02-02 DIAGNOSIS — D508 Other iron deficiency anemias: Secondary | ICD-10-CM | POA: Diagnosis not present

## 2021-02-02 DIAGNOSIS — N029 Recurrent and persistent hematuria with unspecified morphologic changes: Secondary | ICD-10-CM | POA: Diagnosis not present

## 2021-02-02 DIAGNOSIS — E7800 Pure hypercholesterolemia, unspecified: Secondary | ICD-10-CM | POA: Diagnosis not present

## 2021-04-13 DIAGNOSIS — E7800 Pure hypercholesterolemia, unspecified: Secondary | ICD-10-CM | POA: Diagnosis not present

## 2021-04-13 DIAGNOSIS — N029 Recurrent and persistent hematuria with unspecified morphologic changes: Secondary | ICD-10-CM | POA: Diagnosis not present

## 2021-04-13 DIAGNOSIS — I2510 Atherosclerotic heart disease of native coronary artery without angina pectoris: Secondary | ICD-10-CM | POA: Diagnosis not present

## 2021-04-13 DIAGNOSIS — D508 Other iron deficiency anemias: Secondary | ICD-10-CM | POA: Diagnosis not present

## 2021-04-13 DIAGNOSIS — I10 Essential (primary) hypertension: Secondary | ICD-10-CM | POA: Diagnosis not present

## 2021-06-21 DIAGNOSIS — E7800 Pure hypercholesterolemia, unspecified: Secondary | ICD-10-CM | POA: Diagnosis not present

## 2021-06-21 DIAGNOSIS — I10 Essential (primary) hypertension: Secondary | ICD-10-CM | POA: Diagnosis not present

## 2021-06-29 ENCOUNTER — Other Ambulatory Visit: Payer: Self-pay | Admitting: Cardiovascular Disease

## 2021-07-15 ENCOUNTER — Other Ambulatory Visit: Payer: Self-pay | Admitting: Cardiovascular Disease

## 2021-07-23 DIAGNOSIS — H25813 Combined forms of age-related cataract, bilateral: Secondary | ICD-10-CM | POA: Diagnosis not present

## 2021-07-23 DIAGNOSIS — H1045 Other chronic allergic conjunctivitis: Secondary | ICD-10-CM | POA: Diagnosis not present

## 2021-07-23 DIAGNOSIS — H4321 Crystalline deposits in vitreous body, right eye: Secondary | ICD-10-CM | POA: Diagnosis not present

## 2021-07-23 DIAGNOSIS — H40013 Open angle with borderline findings, low risk, bilateral: Secondary | ICD-10-CM | POA: Diagnosis not present

## 2021-09-13 DIAGNOSIS — Z0000 Encounter for general adult medical examination without abnormal findings: Secondary | ICD-10-CM | POA: Diagnosis not present

## 2021-09-13 DIAGNOSIS — I10 Essential (primary) hypertension: Secondary | ICD-10-CM | POA: Diagnosis not present

## 2021-09-13 DIAGNOSIS — E559 Vitamin D deficiency, unspecified: Secondary | ICD-10-CM | POA: Diagnosis not present

## 2021-09-13 DIAGNOSIS — I2510 Atherosclerotic heart disease of native coronary artery without angina pectoris: Secondary | ICD-10-CM | POA: Diagnosis not present

## 2021-09-13 DIAGNOSIS — E7800 Pure hypercholesterolemia, unspecified: Secondary | ICD-10-CM | POA: Diagnosis not present

## 2021-09-19 DIAGNOSIS — L03116 Cellulitis of left lower limb: Secondary | ICD-10-CM | POA: Diagnosis not present

## 2022-01-16 ENCOUNTER — Other Ambulatory Visit: Payer: Self-pay | Admitting: Cardiovascular Disease

## 2022-02-05 DIAGNOSIS — Z808 Family history of malignant neoplasm of other organs or systems: Secondary | ICD-10-CM | POA: Insufficient documentation

## 2022-02-05 DIAGNOSIS — L570 Actinic keratosis: Secondary | ICD-10-CM | POA: Insufficient documentation

## 2022-02-05 DIAGNOSIS — D225 Melanocytic nevi of trunk: Secondary | ICD-10-CM | POA: Insufficient documentation

## 2022-02-05 DIAGNOSIS — D1801 Hemangioma of skin and subcutaneous tissue: Secondary | ICD-10-CM | POA: Insufficient documentation

## 2022-02-05 DIAGNOSIS — L821 Other seborrheic keratosis: Secondary | ICD-10-CM | POA: Insufficient documentation

## 2022-02-05 NOTE — Progress Notes (Unsigned)
Cardiology Office Note:    Date:  02/06/2022   ID:  Cassandra Castillo, DOB [birth year], MRN [id number]  PCP:  Shirline Frees, MD   [facility] Providers Cardiologist:  Jenkins Rouge, MD     Referring MD: Shirline Frees, MD   Chief Complaint: annual follow-up  History of Present Illness:    Cassandra Castillo is a very pleasant  76 y.o. female with a hx of CAD with difficult intervention to mid/distal RCA 2014, HLD, HTN, former tobacco abuse.  She presented 09/2012 with chest pain. Had 1 mm horizontal ST segment depression in inferior lateral leads. Underwent Fiat cath which revealed severe CAD in distal and proximal RCA successful angioplasty and DES but overall a difficult procedure due to bifurcation distally and tortuous right coronary artery.  LAD significantly calcified with mild to moderate nonobstructive disease  Normal myoview June 2021.  Significant bleed post colonoscopy 03/25/2020 requiring transfusion and IR embolization of the vasa recta branches of the distal right colic artery after polypectomy.   She was last seen in our office on 12/12/2020 by Dr. Johnsie Cancel .Completing 20000 steps daily at that time. Advised to return in 1 year.   Today, she is here alone for follow-up and reports she is feeling great. Her walking has decreased slightly due to her husband's recent illness, however she continues to get 15000-20000 steps daily. She denies chest pain, shortness of breath, lower extremity edema, fatigue, palpitations, melena, hematuria, hemoptysis, diaphoresis, weakness, presyncope, syncope, orthopnea, and PND. States she feels as well as she did when she was in her 14s and 52s.    Past Medical History:  Diagnosis Date   Anginal pain ([place])    Coronary artery disease    Hyperlipidemia    Hypertension    Thyroid disease    Has had to intermittently take thyroid medicine in the past   Tobacco abuse     Past Surgical History:  Procedure Laterality Date   BREAST EXCISIONAL BIOPSY Left  1988   BREAST LUMPECTOMY     CHOLECYSTECTOMY     IR ANGIOGRAM SELECTIVE EACH ADDITIONAL VESSEL  03/25/2020   IR ANGIOGRAM SELECTIVE EACH ADDITIONAL VESSEL  03/25/2020   IR ANGIOGRAM SELECTIVE EACH ADDITIONAL VESSEL  03/25/2020   IR ANGIOGRAM SELECTIVE EACH ADDITIONAL VESSEL  03/25/2020   IR ANGIOGRAM VISCERAL SELECTIVE  03/25/2020   IR ANGIOGRAM VISCERAL SELECTIVE  03/25/2020   IR EMBO ART  VEN HEMORR LYMPH EXTRAV  INC GUIDE ROADMAPPING  03/25/2020   IR US GUIDE VASC ACCESS RIGHT  03/25/2020   LEFT HEART CATHETERIZATION WITH CORONARY ANGIOGRAM N/A 10/21/2012   Procedure: LEFT HEART CATHETERIZATION WITH CORONARY ANGIOGRAM;  Surgeon: Wellington Hampshire, MD;  Location: [place] CATH LAB;  Service: Cardiovascular;  Laterality: N/A;   TONSILLECTOMY     TUBAL LIGATION      Current Medications: Current Meds  Medication Sig   aspirin 81 MG EC tablet Take 1 tablet (81 mg total) by mouth daily.   atorvastatin (LIPITOR) 80 MG tablet Take 1 tablet (80 mg total) by mouth daily at 6 PM.   cholecalciferol (VITAMIN D) 1000 UNITS tablet Take 4,000 Units by mouth daily.   losartan-hydrochlorothiazide (HYZAAR) 50-12.5 MG tablet TAKE 1 TABLET BY MOUTH EVERY DAY   Magnesium 400 MG TABS Take 1 tablet by mouth daily.   metoprolol tartrate (LOPRESSOR) 50 MG tablet Take 1 tablet by mouth twice daily   Multiple Vitamins-Minerals (CENTRUM SILVER PO) Take 1 tablet by mouth daily.   [DISCONTINUED] nitroGLYCERIN (NITROSTAT) 0.4 MG  SL tablet Place 1 tablet (0.4 mg total) under the tongue every 5 (five) minutes as needed for chest pain (up to 3 times).     Allergies:   Codeine and Other   Social History   Socioeconomic History   Marital status: Married    Spouse name: Not on file   Number of children: Not on file   Years of education: Not on file   Highest education level: Not on file  Occupational History   Not on file  Tobacco Use   Smoking status: Former    Packs/day: 1.00    Years: 40.00    Total pack years: 40.00     Types: Cigarettes   Smokeless tobacco: Never  Vaping Use   Vaping Use: Never used  Substance and Sexual Activity   Alcohol use: No   Drug use: No   Sexual activity: Not on file  Other Topics Concern   Not on file  Social History Narrative   Not on file   Social Determinants of Health   Financial Resource Strain: Not on file  Food Insecurity: Not on file  Transportation Needs: Not on file  Physical Activity: Not on file  Stress: Not on file  Social Connections: Not on file     Family History: The patient's family history includes Breast cancer in her paternal aunt; COPD in her mother; Coronary artery disease in her paternal uncle; Coronary artery disease (age of onset: 50) in her father; Heart failure in her father; Hemochromatosis in her mother; Hyperlipidemia in her brother.  ROS:   Please see the history of present illness.  All other systems reviewed and are negative.  Labs/Other Studies Reviewed:    The following studies were reviewed today:  Lexiscan myoview 12/02/19  Nuclear stress EF: 60%. Blood pressure demonstrated a hypertensive response to exercise. Horizontal ST segment depression ST segment depression of 0.5 mm was noted during stress in the II, III, aVF, V6 and aVR leads. The study is normal. This is a low risk study.   Low risk stress nuclear study with normal perfusion and normal left ventricular regional and global systolic function.   LHC 10/21/2012  Final Conclusions:  1. Severe one-vessel coronary artery disease in the distal and proximal RCA. The LAD is also significantly calcified with mild to moderate nonobstructive disease. 2 2. Normal LV systolic function. 3. Successful angioplasty and drug-eluting stent placement to the proximal and distal RCA. This was overall a difficult procedure due to bifurcation medication distally and tortuous right coronary artery.   Recent Labs:  Recent Lipid Panel Under chart review   Risk  Assessment/Calculations:      Physical Exam:    VS:  BP 128/72   Pulse (!) 49   Ht '5\' 4"'$  (1.626 m)   Wt 177 lb (80.3 kg)   SpO2 96%   BMI 30.38 kg/m     Wt Readings from Last 3 Encounters:  02/06/22 177 lb (80.3 kg)  12/12/20 179 lb (81.2 kg)  05/30/20 186 lb (84.4 kg)     GEN: Well nourished, well developed in no acute distress HEENT: Normal NECK: No JVD; No carotid bruits CARDIAC: RRR, no murmurs, rubs, gallops RESPIRATORY:  Clear to auscultation without rales, wheezing or rhonchi  ABDOMEN: Soft, non-tender, non-distended MUSCULOSKELETAL:  No edema; No deformity. 2+ pedal pulses, equal bilaterally SKIN: Warm and dry NEUROLOGIC:  Alert and oriented x 3 PSYCHIATRIC:  Normal affect   EKG:  EKG is ordered today.  The ekg ordered  today demonstrates sinus bradycardia at 50 bpm, no ST/T abnormality   Diagnoses:    1. Coronary artery disease involving native coronary artery of native heart without angina pectoris   2. Sinus bradycardia   3. Essential hypertension   4. Hyperlipidemia LDL goal <70    Assessment and Plan:     CAD without angina: Remote stenting of RCA. Stopped Plavix one year ago upon advisement by Dr. Johnsie Cancel. She denies chest pain, dyspnea, or other symptoms concerning for angina. No indication for further ischemic evaluation at this time. No bleeding problems on aspirin. Continue atorvastatin, aspirin, Hyzaar, metoprolol.  Sinus bradycardia: HR 50 bpm on EKG.  Asymptomatic.  Monitors with smart watch at home and HR consistently 50-60 bpm.  HR increases appropriately with exercise. Will continue current dose of metoprolol and asked her to notify us if she experiences fatigue, lightheadedness, presyncope, or syncope prior to next office visit.  Hypertension: BP is well-controlled.  She does not monitor on a consistent basis.  No concerns from recent visits with PCP.  No medication changes today.  Hyperlipidemia LDL goal < 70: LDL 56 on 09/13/21. Continue  Lipitor. Continue mostly plant-based heart healthy diet and regular physical activity.   Disposition: 1 year with Dr. Johnsie Cancel     Medication Adjustments/Labs and Tests Ordered: Current medicines are reviewed at length with the patient today.  Concerns regarding medicines are outlined above.  Orders Placed This Encounter  Procedures   EKG 12-Lead   Meds ordered this encounter  Medications   nitroGLYCERIN (NITROSTAT) 0.4 MG SL tablet    Sig: Place 1 tablet (0.4 mg total) under the tongue every 5 (five) minutes as needed for chest pain (up to 3 times).    Dispense:  25 tablet    Refill:  2    Patient Instructions  Medication Instructions:  Your physician recommends that you continue on your current medications as directed. Please refer to the Current Medication list given to you today. *If you need a refill on your cardiac medications before your next appointment, please call your pharmacy*   Lab Work: None Ordered If you have labs (blood work) drawn today and your tests are completely normal, you will receive your results only by: [place] (if you have MyChart) OR A paper copy in the mail If you have any lab test that is abnormal or we need to change your treatment, we will call you to review the results.   Testing/Procedures: None Ordered   Follow-Up: At [facility], you and your health needs are our priority.  As part of our continuing mission to provide you with exceptional heart care, we have created designated Provider Care Teams.  These Care Teams include your primary Cardiologist (physician) and Advanced Practice Providers (APPs -  Physician Assistants and Nurse Practitioners) who all work together to provide you with the care you need, when you need it.  We recommend signing up for the patient portal called "MyChart".  Sign up information is provided on this After Visit Summary.  MyChart is used to connect with patients for Virtual Visits (Telemedicine).   Patients are able to view lab/test results, encounter notes, upcoming appointments, etc.  Non-urgent messages can be sent to your provider as well.   To learn more about what you can do with MyChart, go to [email or web address].    Your next appointment:   12 month(s)  The format for your next appointment:   In Person  Provider:   Jenkins Rouge, MD  Other Instructions   Important Information About Sugar         Signed, Emmaline Life, NP  02/06/2022 12:42 PM    [place]

## 2022-02-06 ENCOUNTER — Ambulatory Visit (INDEPENDENT_AMBULATORY_CARE_PROVIDER_SITE_OTHER): Payer: Medicare Other | Admitting: Nurse Practitioner

## 2022-02-06 ENCOUNTER — Encounter: Payer: Self-pay | Admitting: Nurse Practitioner

## 2022-02-06 VITALS — BP 128/72 | HR 49 | Ht 64.0 in | Wt 177.0 lb

## 2022-02-06 DIAGNOSIS — R001 Bradycardia, unspecified: Secondary | ICD-10-CM | POA: Diagnosis not present

## 2022-02-06 DIAGNOSIS — I10 Essential (primary) hypertension: Secondary | ICD-10-CM

## 2022-02-06 DIAGNOSIS — E785 Hyperlipidemia, unspecified: Secondary | ICD-10-CM | POA: Diagnosis not present

## 2022-02-06 DIAGNOSIS — I2510 Atherosclerotic heart disease of native coronary artery without angina pectoris: Secondary | ICD-10-CM | POA: Diagnosis not present

## 2022-02-06 MED ORDER — NITROGLYCERIN 0.4 MG SL SUBL: 0.4000 mg | SUBLINGUAL_TABLET | SUBLINGUAL | 2 refills | Status: AC | PRN

## 2022-02-06 NOTE — Patient Instructions (Addendum)
Medication Instructions:  Your physician recommends that you continue on your current medications as directed. Please refer to the Current Medication list given to you today. *If you need a refill on your cardiac medications before your next appointment, please call your pharmacy*   Lab Work: None Ordered If you have labs (blood work) drawn today and your tests are completely normal, you will receive your results only by: [place] (if you have MyChart) OR A paper copy in the mail If you have any lab test that is abnormal or we need to change your treatment, we will call you to review the results.   Testing/Procedures: None Ordered   Follow-Up: At [facility], you and your health needs are our priority.  As part of our continuing mission to provide you with exceptional heart care, we have created designated Provider Care Teams.  These Care Teams include your primary Cardiologist (physician) and Advanced Practice Providers (APPs -  Physician Assistants and Nurse Practitioners) who all work together to provide you with the care you need, when you need it.  We recommend signing up for the patient portal called "MyChart".  Sign up information is provided on this After Visit Summary.  MyChart is used to connect with patients for Virtual Visits (Telemedicine).  Patients are able to view lab/test results, encounter notes, upcoming appointments, etc.  Non-urgent messages can be sent to your provider as well.   To learn more about what you can do with MyChart, go to [email or web address].    Your next appointment:   12 month(s)  The format for your next appointment:   In Person  Provider:   Jenkins Rouge, MD     Other Instructions   Important Information About Sugar

## 2022-02-11 ENCOUNTER — Other Ambulatory Visit: Payer: Self-pay | Admitting: Cardiovascular Disease

## 2022-03-16 IMAGING — MG MM DIGITAL SCREENING BILAT W/ TOMO AND CAD
6 of 10 series · 6 of 30 positions shown · non-contrast
Comparison: Previous exam(s).

CLINICAL DATA: Screening.

EXAM:
DIGITAL SCREENING BILATERAL MAMMOGRAM WITH TOMOSYNTHESIS AND CAD
TECHNIQUE: Bilateral screening digital craniocaudal and mediolateral oblique
mammograms were obtained. Bilateral screening digital breast
tomosynthesis was performed. The images were evaluated with
computer-aided detection.

[R MLO synth-2D (1 of 2)]
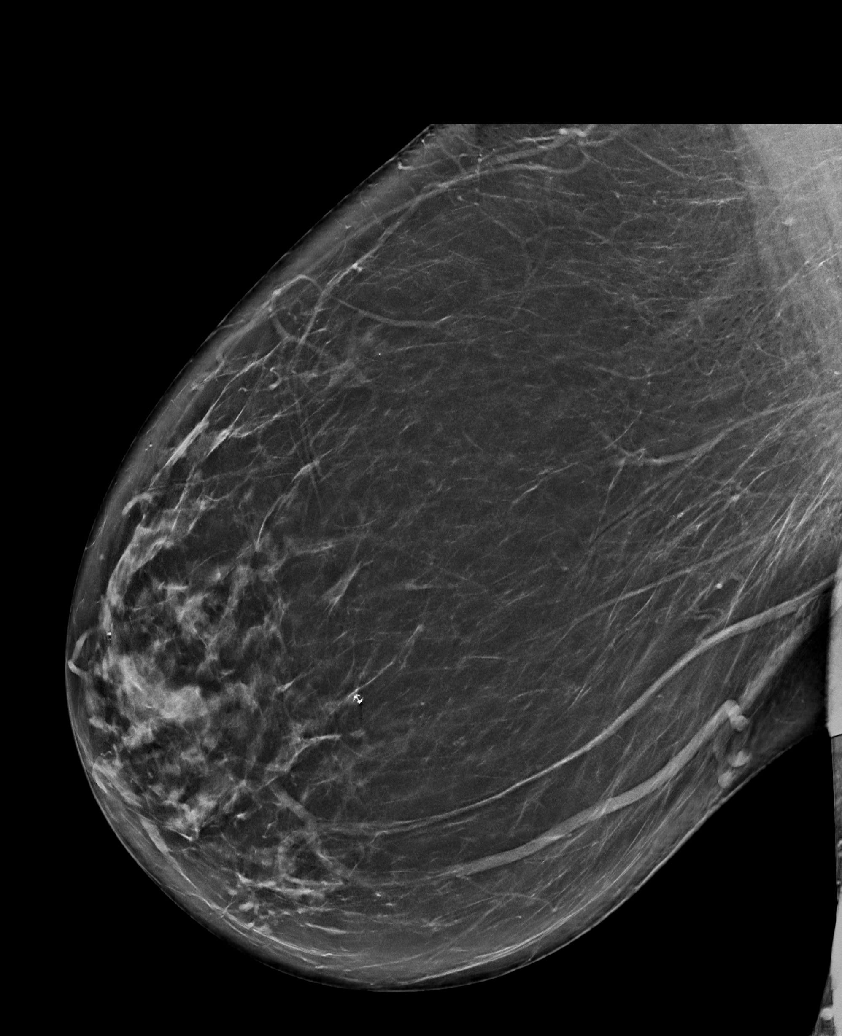

[R MLO synth-2D (2 of 2)]
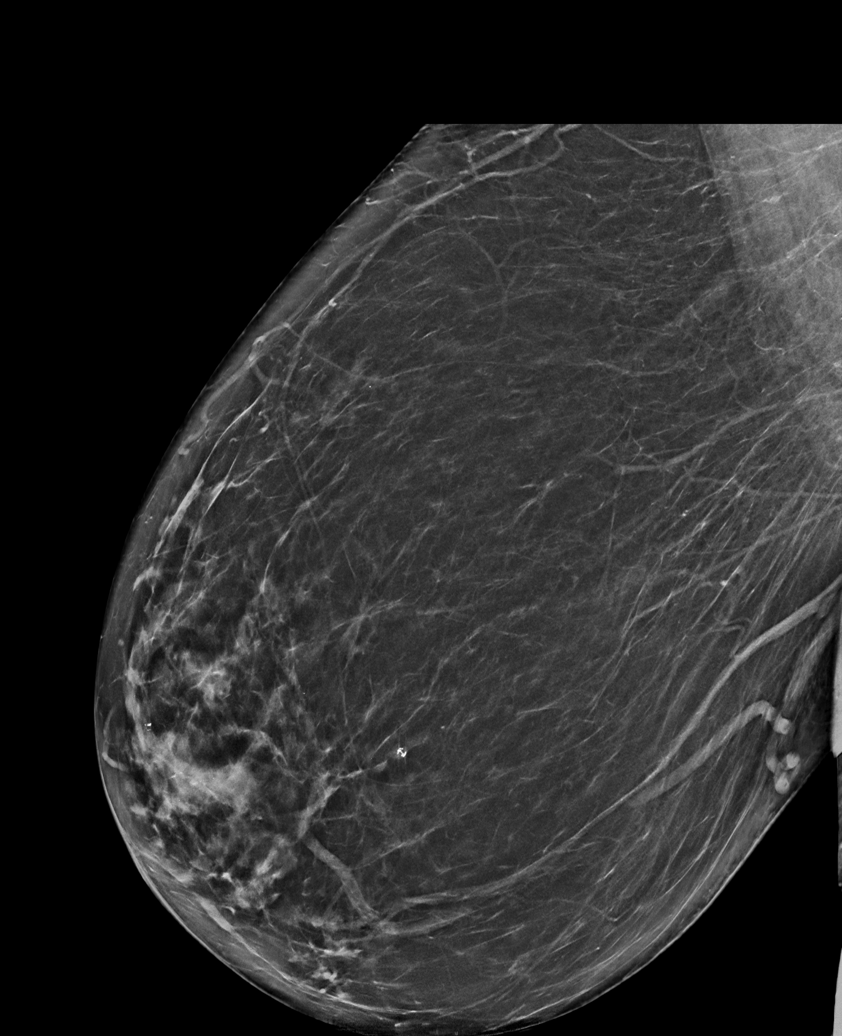

[L CC synth-2D]
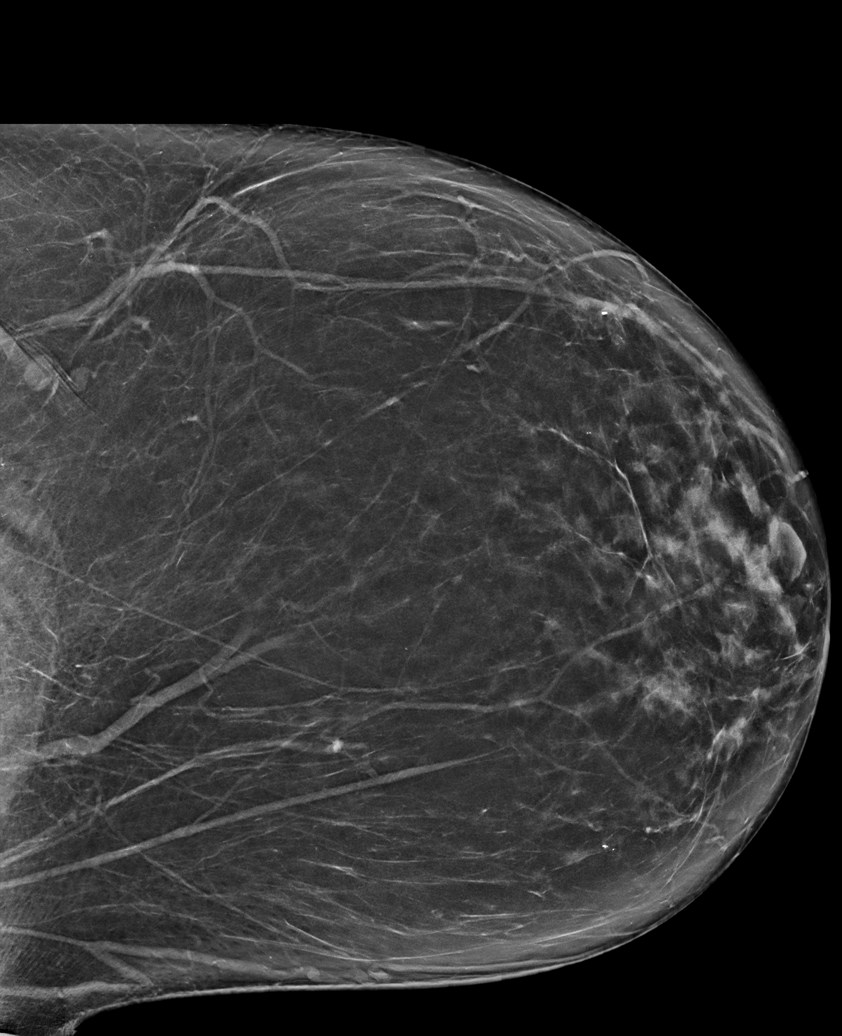

[R CC synth-2D]
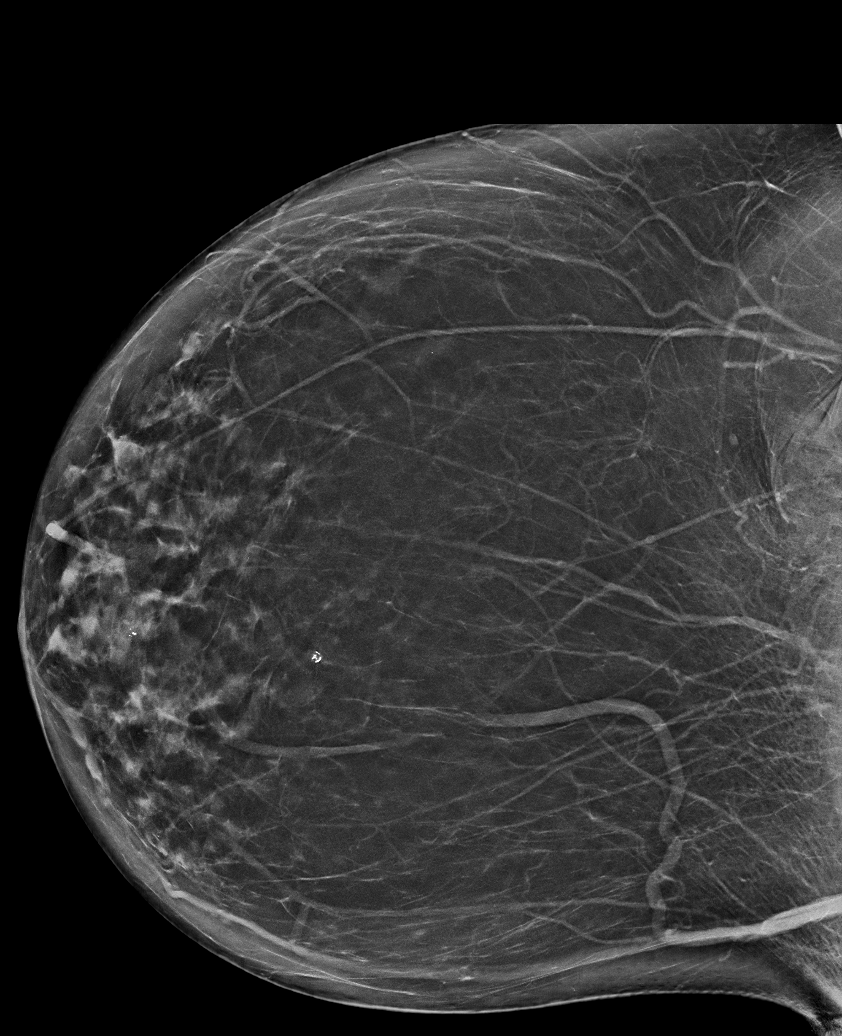

[L MLO synth-2D]
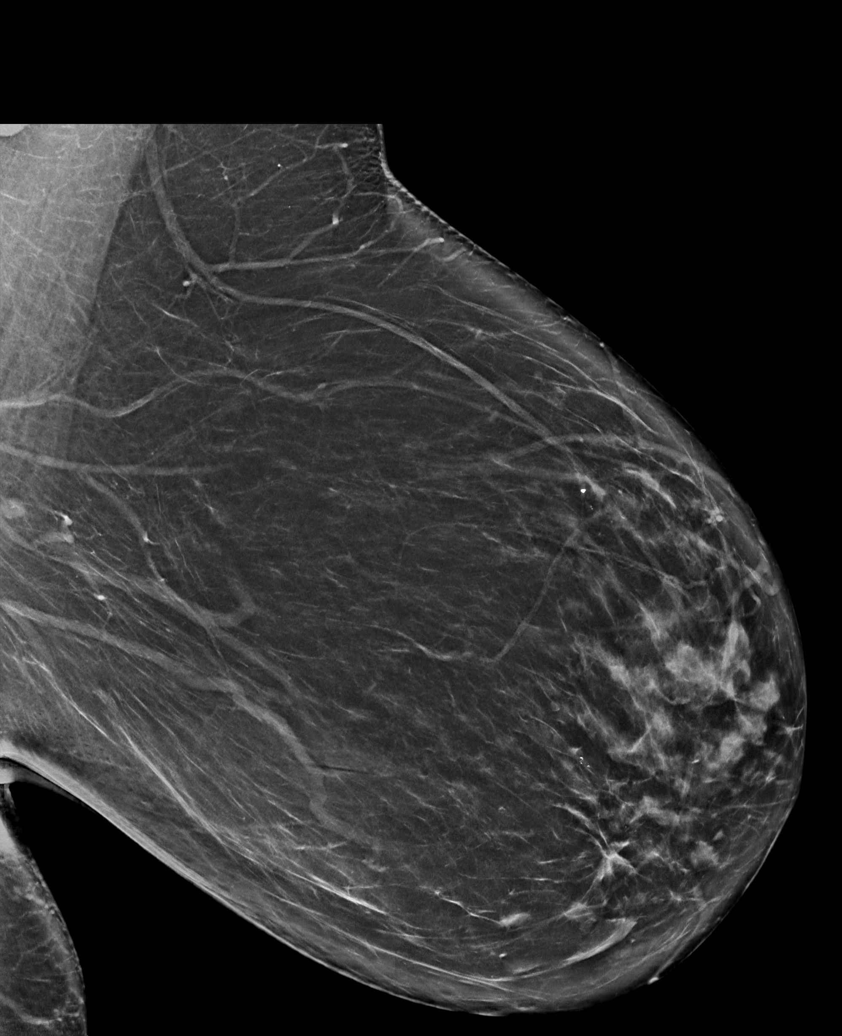

[R MLO tomo · tomo slice 40/79.0]
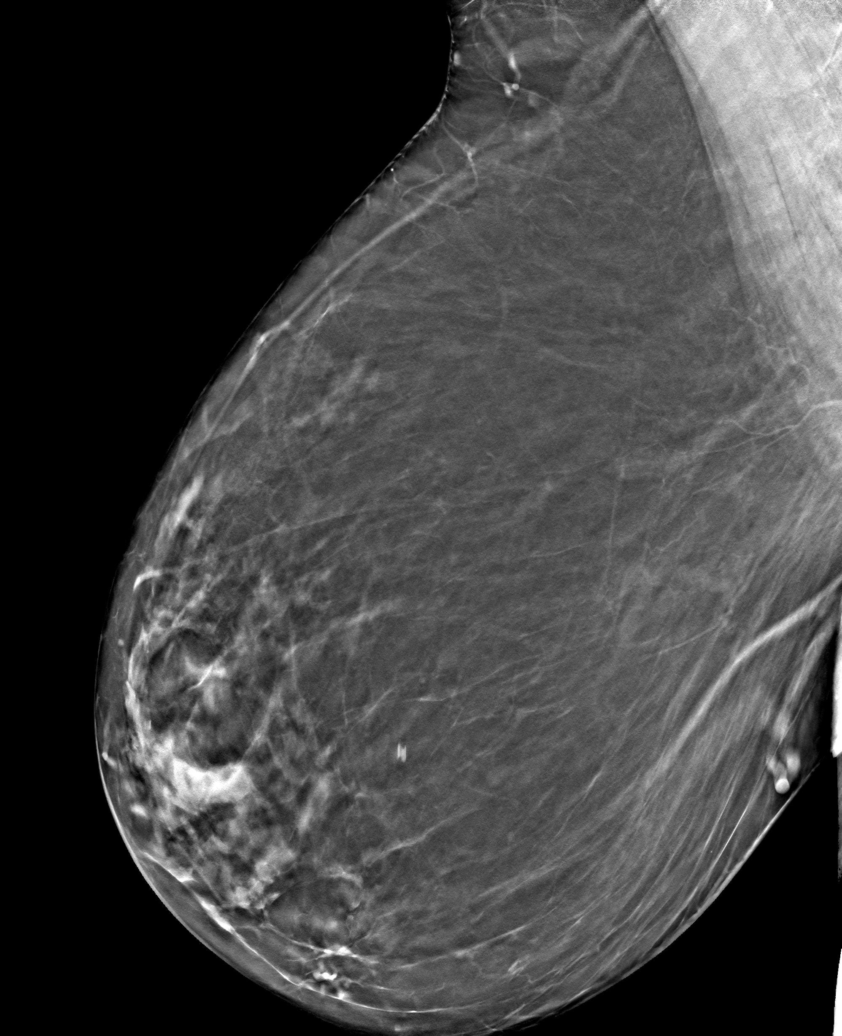

[6 of 30 positions shown; findings below may reference images not displayed]

ACR Breast Density Category b: There are scattered areas of
fibroglandular density.
FINDINGS: There are no findings suspicious for malignancy. The images were
evaluated with computer-aided detection.
IMPRESSION: No mammographic evidence of malignancy. A result letter of this
screening mammogram will be mailed directly to the patient.

RECOMMENDATION:
Screening mammogram in one year. (Code:[id number])

BI-RADS CATEGORY  1: Negative.

## 2022-06-07 ENCOUNTER — Other Ambulatory Visit: Payer: Self-pay | Admitting: Family Medicine

## 2022-06-07 DIAGNOSIS — Z1231 Encounter for screening mammogram for malignant neoplasm of breast: Secondary | ICD-10-CM

## 2022-07-03 ENCOUNTER — Other Ambulatory Visit: Payer: Self-pay | Admitting: Cardiovascular Disease

## 2022-07-04 ENCOUNTER — Other Ambulatory Visit: Payer: Self-pay | Admitting: Family Medicine

## 2022-07-04 DIAGNOSIS — G4489 Other headache syndrome: Secondary | ICD-10-CM | POA: Diagnosis not present

## 2022-07-04 DIAGNOSIS — R1900 Intra-abdominal and pelvic swelling, mass and lump, unspecified site: Secondary | ICD-10-CM

## 2022-07-04 DIAGNOSIS — I2510 Atherosclerotic heart disease of native coronary artery without angina pectoris: Secondary | ICD-10-CM | POA: Diagnosis not present

## 2022-07-04 DIAGNOSIS — E7800 Pure hypercholesterolemia, unspecified: Secondary | ICD-10-CM | POA: Diagnosis not present

## 2022-07-04 DIAGNOSIS — E559 Vitamin D deficiency, unspecified: Secondary | ICD-10-CM | POA: Diagnosis not present

## 2022-07-04 DIAGNOSIS — I10 Essential (primary) hypertension: Secondary | ICD-10-CM | POA: Diagnosis not present

## 2022-07-09 ENCOUNTER — Ambulatory Visit
Admission: RE | Admit: 2022-07-09 | Discharge: 2022-07-09 | Disposition: A | Discharge status: Home / Self Care | Payer: Medicare Other | Source: Ambulatory Visit | Attending: Family Medicine | Admitting: Family Medicine

## 2022-07-09 DIAGNOSIS — R1900 Intra-abdominal and pelvic swelling, mass and lump, unspecified site: Secondary | ICD-10-CM

## 2022-07-09 DIAGNOSIS — M47816 Spondylosis without myelopathy or radiculopathy, lumbar region: Secondary | ICD-10-CM | POA: Diagnosis not present

## 2022-07-09 DIAGNOSIS — K5730 Diverticulosis of large intestine without perforation or abscess without bleeding: Secondary | ICD-10-CM | POA: Diagnosis not present

## 2022-07-09 DIAGNOSIS — K4090 Unilateral inguinal hernia, without obstruction or gangrene, not specified as recurrent: Secondary | ICD-10-CM | POA: Diagnosis not present

## 2022-07-09 DIAGNOSIS — K760 Fatty (change of) liver, not elsewhere classified: Secondary | ICD-10-CM | POA: Diagnosis not present

## 2022-07-09 MED ORDER — IOPAMIDOL (ISOVUE-300) INJECTION 61%
100.0000 mL | Freq: Once | INTRAVENOUS | Status: AC | PRN
  Administered 2022-07-09: 100 mL via INTRAVENOUS

## 2022-07-25 DIAGNOSIS — H25813 Combined forms of age-related cataract, bilateral: Secondary | ICD-10-CM | POA: Diagnosis not present

## 2022-07-25 DIAGNOSIS — H35031 Hypertensive retinopathy, right eye: Secondary | ICD-10-CM | POA: Diagnosis not present

## 2022-07-25 DIAGNOSIS — H4321 Crystalline deposits in vitreous body, right eye: Secondary | ICD-10-CM | POA: Diagnosis not present

## 2022-07-25 DIAGNOSIS — H40013 Open angle with borderline findings, low risk, bilateral: Secondary | ICD-10-CM | POA: Diagnosis not present

## 2022-07-30 DIAGNOSIS — I2510 Atherosclerotic heart disease of native coronary artery without angina pectoris: Secondary | ICD-10-CM | POA: Diagnosis not present

## 2022-07-30 DIAGNOSIS — K7689 Other specified diseases of liver: Secondary | ICD-10-CM | POA: Diagnosis not present

## 2022-07-30 DIAGNOSIS — R935 Abnormal findings on diagnostic imaging of other abdominal regions, including retroperitoneum: Secondary | ICD-10-CM | POA: Diagnosis not present

## 2022-07-30 DIAGNOSIS — Z86010 Personal history of colonic polyps: Secondary | ICD-10-CM | POA: Diagnosis not present

## 2022-07-30 DIAGNOSIS — K4090 Unilateral inguinal hernia, without obstruction or gangrene, not specified as recurrent: Secondary | ICD-10-CM | POA: Diagnosis not present

## 2022-07-31 ENCOUNTER — Other Ambulatory Visit: Payer: Self-pay | Admitting: Physician Assistant

## 2022-07-31 DIAGNOSIS — R935 Abnormal findings on diagnostic imaging of other abdominal regions, including retroperitoneum: Secondary | ICD-10-CM

## 2022-08-01 ENCOUNTER — Ambulatory Visit: Payer: Medicare Other

## 2022-08-05 ENCOUNTER — Ambulatory Visit
Admission: RE | Admit: 2022-08-05 | Discharge: 2022-08-05 | Disposition: A | Discharge status: Home / Self Care | Payer: Medicare Other | Source: Ambulatory Visit | Attending: Family Medicine | Admitting: Family Medicine

## 2022-08-05 DIAGNOSIS — Z1231 Encounter for screening mammogram for malignant neoplasm of breast: Secondary | ICD-10-CM

## 2022-08-15 ENCOUNTER — Ambulatory Visit: Payer: Self-pay | Admitting: General Surgery

## 2022-08-15 DIAGNOSIS — K4090 Unilateral inguinal hernia, without obstruction or gangrene, not specified as recurrent: Secondary | ICD-10-CM | POA: Diagnosis not present

## 2022-08-20 ENCOUNTER — Ambulatory Visit
Admission: RE | Admit: 2022-08-20 | Discharge: 2022-08-20 | Disposition: A | Discharge status: Home / Self Care | Payer: Medicare Other | Source: Ambulatory Visit | Attending: Physician Assistant | Admitting: Physician Assistant

## 2022-08-20 DIAGNOSIS — R935 Abnormal findings on diagnostic imaging of other abdominal regions, including retroperitoneum: Secondary | ICD-10-CM | POA: Diagnosis not present

## 2022-08-20 MED ORDER — GADOPICLENOL 0.5 MMOL/ML IV SOLN
8.0000 mL | Freq: Once | INTRAVENOUS | Status: AC | PRN
  Administered 2022-08-20: 8 mL via INTRAVENOUS

## 2022-08-26 NOTE — Patient Instructions (Addendum)
DUE TO COVID-19 ONLY TWO VISITORS  (aged 76 and older)  ARE ALLOWED TO COME WITH YOU AND STAY IN THE WAITING ROOM ONLY DURING PRE OP AND PROCEDURE.   **NO VISITORS ARE ALLOWED IN THE SHORT STAY AREA OR RECOVERY ROOM!!**  IF YOU WILL BE ADMITTED INTO THE HOSPITAL YOU ARE ALLOWED ONLY FOUR SUPPORT PEOPLE DURING VISITATION HOURS ONLY (7 AM -8PM)   The support person(s) must pass our screening, gel in and out, and wear a mask at all times, including in the patient's room. Patients must also wear a mask when staff or their support person are in the room. Visitors GUEST BADGE MUST BE WORN VISIBLY  One adult visitor may remain with you overnight and MUST be in the room by 8 P.M.     Your procedure is scheduled on: 09/05/22   Report to [facility] Main Entrance    Report to admitting at  5:15 AM   Call this number if you have problems the morning of surgery [phone]   Do not eat food :After Midnight.   After Midnight you may have the following liquids until _4:30 AM/ DAY OF SURGERY  Water Black Coffee (sugar ok, NO MILK/CREAM OR CREAMERS)  Tea (sugar ok, NO MILK/CREAM OR CREAMERS) regular and decaf                             Plain Jell-O (NO RED)                                           Fruit ices (not with fruit pulp, NO RED)                                     Popsicles (NO RED)                                                                  Juice: apple, WHITE grape, WHITE cranberry Sports drinks like Gatorade (NO RED)               If you have questions, please contact your surgeon's office.  Oral Hygiene is also important to reduce your risk of infection.                                    Remember - BRUSH YOUR TEETH THE MORNING OF SURGERY WITH YOUR REGULAR TOOTHPASTE  DENTURES WILL BE REMOVED PRIOR TO SURGERY PLEASE DO NOT APPLY "Poly grip" OR ADHESIVES!!!   Do NOT smoke after Midnight   Take these medicines the morning of surgery with A SIP OF WATER:  Atorvastatin  Metoprolol   Before surgery.Stop taking _ASA 81 mg__________on __________as instructed by _____________.  Stop taking ____________as directed by your Surgeon/Cardiologist.  Contact your Surgeon/Cardiologist for instructions on Anticoagulant Therapy prior to surgery.   Bring CPAP mask and tubing day of surgery.                              You may not have any metal on your body including hair pins, jewelry, and body piercing             Do not wear make-up, lotions, powders, perfumes, or deodorant  Do not wear nail polish including gel and S&S, artificial/acrylic nails, or any other type of covering on natural nails including finger and toenails. If you have artificial nails, gel coating, etc. that needs to be removed by a nail salon please have this removed prior to surgery or surgery may need to be canceled/ delayed if the surgeon/ anesthesia feels like they are unable to be safely monitored.   Do not shave  48 hours prior to surgery.     Do not bring valuables to the hospital. [place].   Contacts, glasses, or bridgework may not be worn into surgery.   Bring small overnight bag day of surgery.   DO NOT [place]. PHARMACY WILL DISPENSE MEDICATIONS LISTED ON YOUR MEDICATION LIST TO YOU DURING YOUR ADMISSION [place]!       Special Instructions: Bring a copy of your healthcare power of attorney and living will documents the day of surgery if you haven't scanned them before.              Please read over the following fact sheets you were given: IF YOU HAVE QUESTIONS ABOUT YOUR PRE-OP INSTRUCTIONS PLEASE CALL [phone]    [facility] - Preparing for Surgery Before surgery, you can play an important role.  Because skin is not sterile, your skin needs to  be as free of germs as possible.  You can reduce the number of germs on your skin by washing with CHG (chlorahexidine gluconate) soap before surgery.  CHG is an antiseptic cleaner which kills germs and bonds with the skin to continue killing germs even after washing. Please DO NOT use if you have an allergy to CHG or antibacterial soaps.  If your skin becomes reddened/irritated stop using the CHG and inform your nurse when you arrive at Short Stay. Do not shave (including legs and underarms) for at least 48 hours prior to the first CHG shower.   Please follow these instructions carefully:  1.  Shower with CHG Soap the night before surgery and the  morning of Surgery.  2.  If you choose to wash your hair, wash your hair first as usual with your  normal  shampoo.  3.  After you shampoo, rinse your hair and body thoroughly to remove the  shampoo.                            4.  Use CHG as you would any other liquid soap.  You can apply chg directly  to the skin and wash                       Gently  with a scrungie or clean washcloth.  5.  Apply the CHG Soap to your body ONLY FROM THE NECK DOWN.   Do not use on face/ open                           Wound or open sores. Avoid contact with eyes, ears mouth and genitals (private parts).                       Wash face,  Genitals (private parts) with your normal soap.             6.  Wash thoroughly, paying special attention to the area where your surgery  will be performed.  7.  Thoroughly rinse your body with warm water from the neck down.  8.  DO NOT shower/wash with your normal soap after using and rinsing off  the CHG Soap.             9.  Pat yourself dry with a clean towel.            10.  Wear clean pajamas.            11.  Place clean sheets on your bed the night of your first shower and do not  sleep with pets. Day of Surgery : Do not apply any lotions/deodorants the morning of surgery.  Please wear clean clothes to the hospital/surgery  center.  FAILURE TO FOLLOW THESE INSTRUCTIONS MAY RESULT IN THE CANCELLATION OF YOUR SURGERY  PATIENT SIGNATURE_________________________________  ________________________________________________________________________

## 2022-08-29 ENCOUNTER — Other Ambulatory Visit: Payer: Self-pay

## 2022-08-29 ENCOUNTER — Encounter (HOSPITAL_COMMUNITY)
Admission: RE | Admit: 2022-08-29 | Discharge: 2022-08-29 | Disposition: A | Discharge status: Home / Self Care | Payer: Medicare Other | Source: Ambulatory Visit | Attending: General Surgery | Admitting: General Surgery

## 2022-08-29 ENCOUNTER — Encounter (HOSPITAL_COMMUNITY): Payer: Self-pay

## 2022-08-29 DIAGNOSIS — Z87891 Personal history of nicotine dependence: Secondary | ICD-10-CM | POA: Insufficient documentation

## 2022-08-29 DIAGNOSIS — Z01812 Encounter for preprocedural laboratory examination: Secondary | ICD-10-CM | POA: Insufficient documentation

## 2022-08-29 DIAGNOSIS — I2510 Atherosclerotic heart disease of native coronary artery without angina pectoris: Secondary | ICD-10-CM | POA: Diagnosis not present

## 2022-08-29 DIAGNOSIS — K4090 Unilateral inguinal hernia, without obstruction or gangrene, not specified as recurrent: Secondary | ICD-10-CM | POA: Diagnosis not present

## 2022-08-29 DIAGNOSIS — I10 Essential (primary) hypertension: Secondary | ICD-10-CM | POA: Insufficient documentation

## 2022-08-29 HISTORY — DX: Personal history of other diseases of the digestive system: Z87.19

## 2022-08-29 HISTORY — DX: Other complications of anesthesia, initial encounter: T88.59XA

## 2022-08-29 HISTORY — DX: Unspecified osteoarthritis, unspecified site: M19.90

## 2022-08-29 LAB — CBC
HCT: 42.5 % (ref 36.0–46.0)
Hemoglobin: 14.2 g/dL (ref 12.0–15.0)
MCH: 31.1 pg (ref 26.0–34.0)
MCHC: 33.4 g/dL (ref 30.0–36.0)
MCV: 93.2 fL (ref 80.0–100.0)
Platelets: 293 10*3/uL (ref 150–400)
RBC: 4.56 MIL/uL (ref 3.87–5.11)
RDW: 12.7 % (ref 11.5–15.5)
WBC: 12.4 10*3/uL — ABNORMAL HIGH (ref 4.0–10.5)
nRBC: 0 % (ref 0.0–0.2)

## 2022-08-29 LAB — BASIC METABOLIC PANEL
Anion gap: 3 — ABNORMAL LOW (ref 5–15)
BUN: 10 mg/dL (ref 8–23)
CO2: 28 mmol/L (ref 22–32)
Calcium: 9 mg/dL (ref 8.9–10.3)
Chloride: 104 mmol/L (ref 98–111)
Creatinine, Ser: 0.64 mg/dL (ref 0.44–1.00)
GFR, Estimated: >60 mL/min (ref >60)
Glucose, Bld: 99 mg/dL (ref 70–99)
Potassium: 4.6 mmol/L (ref 3.5–5.1)
Sodium: 135 mmol/L (ref 135–145)

## 2022-08-29 NOTE — Progress Notes (Signed)
Anesthesia note:  Bowel prep reminder:  NA  PCP - Dr. Rogers Blocker Cardiologist -Dr. Jenkins Rouge Other-   Chest x-ray - no EKG - 02/06/22-epic Stress Test - 2021 ECHO - no Cardiac Cath - 2014 with 2 stents CABG-no Pacemaker/ICD device last checked:NA  Sleep Study - no CPAP -   Pt is pre diabetic-no CBG at PAT visit- Fasting Blood Sugar at home- Checks Blood Sugar _____  Blood Thinner:ASA 81 mg Blood Thinner Instructions: Aspirin Instructions:none Last Dose:08/29/22  Anesthesia review: Yes  reason:  Patient denies shortness of breath, fever, cough and chest pain at PAT appointment. Pt has no SOB with activities. She said that anesthesia and pain medications severely affect her and she takes a long time to wake up.   Patient verbalized understanding of instructions that were given to them at the PAT appointment. Patient was also instructed that they will need to review over the PAT instructions again at home before surgery.yes

## 2022-08-30 NOTE — Progress Notes (Signed)
Anesthesia Chart Review   Case: [id number] Date/Time: 09/05/22 0715   Procedure: XI ROBOTIC ASSISTED RIGHT INGUINAL HERNIA REPAIR WITH MESH (Right)   Anesthesia type: General   Pre-op diagnosis: RIGHT INGUINAL HERNIA   Location: [facility] ROOM 02 / [facility]   Surgeons: Kinsinger, Arta Bruce, MD       DISCUSSION:77 y.o. former smoker with h/o HTN, CAD (stent to RCA), asymptomatic bradycardia, right inguinal hernia scheduled for above procedure 09/05/2022 with Dr. Gurney Maxin.   Pt last seen by cardiology 02/06/2022. Per OV note stable at this visit with 1 year follow up recommended.   Anticipate pt can proceed with planned procedure barring acute status change.   VS: BP 135/70   Pulse (!) 56   Temp 36.8 C (Oral)   Resp 18   Ht '5\' 4"'$  (1.626 m)   Wt 78.5 kg   SpO2 95%   BMI 29.70 kg/m   PROVIDERS: Shirline Frees, MD is PCP   Cardiologist -Dr. Jenkins Rouge  LABS: Labs reviewed: Acceptable for surgery. (all labs ordered are listed, but only abnormal results are displayed)  Labs Reviewed  BASIC METABOLIC PANEL - Abnormal; Notable for the following components:      Result Value   Anion gap 3 (*)    All other components within normal limits  CBC - Abnormal; Notable for the following components:   WBC 12.4 (*)    All other components within normal limits     IMAGES:   EKG:   CV: Myocardial Perfusion 12/02/2019 Nuclear stress EF: 60%. Blood pressure demonstrated a hypertensive response to exercise. Horizontal ST segment depression ST segment depression of 0.5 mm was noted during stress in the II, III, aVF, V6 and aVR leads. The study is normal. This is a low risk study.   Low risk stress nuclear study with normal perfusion and normal left ventricular regional and global systolic function. Past Medical History:  Diagnosis Date   Arthritis    hands   Complication of anesthesia    slow to wake up   Coronary artery disease    History of hiatal hernia    Hyperlipidemia     Hypertension    Thyroid disease 1970   Has had to intermittently take thyroid medicine in the past    Past Surgical History:  Procedure Laterality Date   BREAST EXCISIONAL BIOPSY Left [place]   COLONOSCOPY  2022   had a GI bleed after and needed 2 units of blood   IR ANGIOGRAM SELECTIVE EACH ADDITIONAL VESSEL  03/25/2020   IR ANGIOGRAM SELECTIVE EACH ADDITIONAL VESSEL  03/25/2020   IR ANGIOGRAM SELECTIVE EACH ADDITIONAL VESSEL  03/25/2020   IR ANGIOGRAM SELECTIVE EACH ADDITIONAL VESSEL  03/25/2020   IR ANGIOGRAM VISCERAL SELECTIVE  03/25/2020   IR ANGIOGRAM VISCERAL SELECTIVE  03/25/2020   IR EMBO ART  VEN HEMORR LYMPH EXTRAV  INC GUIDE ROADMAPPING  03/25/2020   IR US GUIDE VASC ACCESS RIGHT  03/25/2020   LEFT HEART CATHETERIZATION WITH CORONARY ANGIOGRAM N/A 10/21/2012   Procedure: LEFT HEART CATHETERIZATION WITH CORONARY ANGIOGRAM;  Surgeon: Wellington Hampshire, MD;  Location: [place] CATH LAB;  Service: Cardiovascular;  Laterality: N/A;   TONSILLECTOMY     age 77   TUBAL LIGATION  1983    MEDICATIONS:  aspirin 81 MG EC tablet   atorvastatin (LIPITOR) 80 MG tablet   Cholecalciferol (VITAMIN D) 50 MCG (2000 UT) CAPS   losartan-hydrochlorothiazide (HYZAAR) 50-12.5 MG tablet   Magnesium  400 MG TABS   metoprolol tartrate (LOPRESSOR) 50 MG tablet   Multiple Vitamins-Minerals (CENTRUM SILVER PO)   naproxen sodium (ALEVE) 220 MG tablet   nitroGLYCERIN (NITROSTAT) 0.4 MG SL tablet   No current facility-administered medications for this encounter.     Konrad Felix Ward, PA-C [facility] Pre-Surgical Testing [phone]

## 2022-09-04 NOTE — Anesthesia Preprocedure Evaluation (Addendum)
Anesthesia Evaluation  Patient identified by MRN, date of birth, ID band Patient awake    Reviewed: Allergy & Precautions, NPO status , Patient's Chart, lab work & pertinent test results  History of Anesthesia Complications (+) history of anesthetic complications (slow to wake up)  Airway Mallampati: II  TM Distance: >3 FB Neck ROM: Full    Dental  (+) Upper Dentures, Lower Dentures   Pulmonary neg shortness of breath, neg sleep apnea, neg COPD, neg recent URI, former smoker   Pulmonary exam normal breath sounds clear to auscultation       Cardiovascular hypertension (losartan-HCTZ, metoprolol), Pt. on medications and Pt. on home beta blockers (-) angina + CAD and + Cardiac Stents (x2 in 2014)  (-) CABG (-) dysrhythmias  Rhythm:Regular Rate:Normal  HLD  Myocardial Perfusion Scan 11/30/2019: normal  LHC 10/21/2012   Final Conclusions:  1. Severe one-vessel coronary artery disease in the distal and proximal RCA. The LAD is also significantly calcified with mild to moderate nonobstructive disease. 2 2. Normal LV systolic function. 3. Successful angioplasty and drug-eluting stent placement to the proximal and distal RCA. This was overall a difficult procedure due to bifurcation medication distally and tortuous right coronary artery.     Neuro/Psych negative neurological ROS     GI/Hepatic Neg liver ROS, hiatal hernia,neg GERD  ,,  Endo/Other  negative endocrine ROS    Renal/GU negative Renal ROS     Musculoskeletal  (+) Arthritis ,    Abdominal   Peds  Hematology  (+) Blood dyscrasia, anemia   Anesthesia Other Findings   Reproductive/Obstetrics                             Anesthesia Physical Anesthesia Plan  ASA: 3  Anesthesia Plan: General   Post-op Pain Management: Tylenol PO (pre-op)*   Induction: Intravenous  PONV Risk Score and Plan: 3 and Ondansetron, Dexamethasone and  Treatment may vary due to age or medical condition  Airway Management Planned: Oral ETT  Additional Equipment:   Intra-op Plan:   Post-operative Plan: Extubation in OR  Informed Consent: I have reviewed the patients History and Physical, chart, labs and discussed the procedure including the risks, benefits and alternatives for the proposed anesthesia with the patient or authorized representative who has indicated his/her understanding and acceptance.     Dental advisory given  Plan Discussed with: CRNA and Anesthesiologist  Anesthesia Plan Comments: (Risks of general anesthesia discussed including, but not limited to, sore throat, hoarse voice, chipped/damaged teeth, injury to vocal cords, nausea and vomiting, allergic reactions, lung infection, heart attack, stroke, and death. All questions answered. )        Anesthesia Quick Evaluation

## 2022-09-05 ENCOUNTER — Ambulatory Visit (HOSPITAL_COMMUNITY)
Admission: RE | Admit: 2022-09-05 | Discharge: 2022-09-05 | Disposition: A | Discharge status: Home / Self Care | Payer: Medicare Other | Attending: General Surgery | Admitting: General Surgery

## 2022-09-05 ENCOUNTER — Encounter (HOSPITAL_COMMUNITY): Payer: Self-pay | Admitting: General Surgery

## 2022-09-05 ENCOUNTER — Other Ambulatory Visit: Payer: Self-pay

## 2022-09-05 ENCOUNTER — Encounter (HOSPITAL_COMMUNITY)
Admission: RE | Disposition: A | Discharge status: Home / Self Care | Payer: Self-pay | Source: Home / Self Care | Attending: General Surgery

## 2022-09-05 ENCOUNTER — Ambulatory Visit (HOSPITAL_COMMUNITY): Payer: Medicare Other | Admitting: Physician Assistant

## 2022-09-05 ENCOUNTER — Encounter (HOSPITAL_COMMUNITY): Payer: Self-pay

## 2022-09-05 ENCOUNTER — Ambulatory Visit (HOSPITAL_BASED_OUTPATIENT_CLINIC_OR_DEPARTMENT_OTHER): Payer: Medicare Other | Admitting: Certified Registered Nurse Anesthetist

## 2022-09-05 DIAGNOSIS — Z87891 Personal history of nicotine dependence: Secondary | ICD-10-CM | POA: Diagnosis not present

## 2022-09-05 DIAGNOSIS — E785 Hyperlipidemia, unspecified: Secondary | ICD-10-CM | POA: Insufficient documentation

## 2022-09-05 DIAGNOSIS — I10 Essential (primary) hypertension: Secondary | ICD-10-CM

## 2022-09-05 DIAGNOSIS — I2510 Atherosclerotic heart disease of native coronary artery without angina pectoris: Secondary | ICD-10-CM | POA: Insufficient documentation

## 2022-09-05 DIAGNOSIS — Z79899 Other long term (current) drug therapy: Secondary | ICD-10-CM | POA: Insufficient documentation

## 2022-09-05 DIAGNOSIS — Z955 Presence of coronary angioplasty implant and graft: Secondary | ICD-10-CM | POA: Insufficient documentation

## 2022-09-05 DIAGNOSIS — K4090 Unilateral inguinal hernia, without obstruction or gangrene, not specified as recurrent: Secondary | ICD-10-CM | POA: Insufficient documentation

## 2022-09-05 HISTORY — PX: XI ROBOTIC ASSISTED INGUINAL HERNIA REPAIR WITH MESH: SHX6706

## 2022-09-05 SURGERY — REPAIR, HERNIA, INGUINAL, ROBOT-ASSISTED, LAPAROSCOPIC, USING MESH
Anesthesia: General | Laterality: Right

## 2022-09-05 MED ORDER — ORAL CARE MOUTH RINSE: 15.0000 mL | Freq: Once | OROMUCOSAL | Status: AC

## 2022-09-05 MED ORDER — ACETAMINOPHEN 500 MG PO TABS
1000.0000 mg | ORAL_TABLET | ORAL | Status: AC
  Administered 2022-09-05: 1000 mg via ORAL
  Filled 2022-09-05: qty 2

## 2022-09-05 MED ORDER — METOPROLOL TARTRATE 25 MG PO TABS
50.0000 mg | ORAL_TABLET | Freq: Once | ORAL | Status: AC
  Administered 2022-09-05: 50 mg via ORAL
  Filled 2022-09-05: qty 2

## 2022-09-05 MED ORDER — AMISULPRIDE (ANTIEMETIC) 5 MG/2ML IV SOLN: 10.0000 mg | Freq: Once | INTRAVENOUS | Status: AC | PRN

## 2022-09-05 MED ORDER — BUPIVACAINE LIPOSOME 1.3 % IJ SUSP
INTRAMUSCULAR | Status: AC
  Filled 2022-09-05: qty 20

## 2022-09-05 MED ORDER — CHLORHEXIDINE GLUCONATE CLOTH 2 % EX PADS: 6.0000 | MEDICATED_PAD | Freq: Once | CUTANEOUS | Status: DC

## 2022-09-05 MED ORDER — FENTANYL CITRATE (PF) 100 MCG/2ML IJ SOLN
INTRAMUSCULAR | Status: AC
  Filled 2022-09-05: qty 2

## 2022-09-05 MED ORDER — LIDOCAINE HCL (PF) 2 % IJ SOLN
INTRAMUSCULAR | Status: AC
  Filled 2022-09-05: qty 5

## 2022-09-05 MED ORDER — LACTATED RINGERS IV SOLN: INTRAVENOUS | Status: DC

## 2022-09-05 MED ORDER — DEXAMETHASONE SODIUM PHOSPHATE 10 MG/ML IJ SOLN
INTRAMUSCULAR | Status: DC | PRN
  Administered 2022-09-05: 5 mg via INTRAVENOUS

## 2022-09-05 MED ORDER — PROPOFOL 500 MG/50ML IV EMUL
INTRAVENOUS | Status: DC | PRN
  Administered 2022-09-05: 150 ug/kg/min via INTRAVENOUS

## 2022-09-05 MED ORDER — OXYCODONE HCL 5 MG PO TABS
ORAL_TABLET | ORAL | Status: AC
  Filled 2022-09-05: qty 1

## 2022-09-05 MED ORDER — AMISULPRIDE (ANTIEMETIC) 5 MG/2ML IV SOLN
INTRAVENOUS | Status: AC
  Administered 2022-09-05: 10 mg via INTRAVENOUS
  Filled 2022-09-05: qty 4

## 2022-09-05 MED ORDER — FENTANYL CITRATE PF 50 MCG/ML IJ SOSY
25.0000 ug | PREFILLED_SYRINGE | INTRAMUSCULAR | Status: DC | PRN
  Administered 2022-09-05: 25 ug via INTRAVENOUS

## 2022-09-05 MED ORDER — IBUPROFEN 800 MG PO TABS: 800.0000 mg | ORAL_TABLET | Freq: Three times a day (TID) | ORAL | 0 refills | Status: AC | PRN

## 2022-09-05 MED ORDER — BUPIVACAINE LIPOSOME 1.3 % IJ SUSP
INTRAMUSCULAR | Status: DC | PRN
  Administered 2022-09-05: 20 mL

## 2022-09-05 MED ORDER — ROCURONIUM BROMIDE 10 MG/ML (PF) SYRINGE
PREFILLED_SYRINGE | INTRAVENOUS | Status: AC
  Filled 2022-09-05: qty 10

## 2022-09-05 MED ORDER — FENTANYL CITRATE (PF) 100 MCG/2ML IJ SOLN
INTRAMUSCULAR | Status: DC | PRN
  Administered 2022-09-05 (×3): 25 ug via INTRAVENOUS

## 2022-09-05 MED ORDER — CHLORHEXIDINE GLUCONATE 0.12 % MT SOLN
15.0000 mL | Freq: Once | OROMUCOSAL | Status: AC
  Administered 2022-09-05: 15 mL via OROMUCOSAL

## 2022-09-05 MED ORDER — EPHEDRINE SULFATE-NACL 50-0.9 MG/10ML-% IV SOSY
PREFILLED_SYRINGE | INTRAVENOUS | Status: DC | PRN
  Administered 2022-09-05: 10 mg via INTRAVENOUS
  Administered 2022-09-05: 15 mg via INTRAVENOUS

## 2022-09-05 MED ORDER — ROCURONIUM BROMIDE 10 MG/ML (PF) SYRINGE
PREFILLED_SYRINGE | INTRAVENOUS | Status: DC | PRN
  Administered 2022-09-05: 20 mg via INTRAVENOUS
  Administered 2022-09-05: 50 mg via INTRAVENOUS

## 2022-09-05 MED ORDER — PROPOFOL 10 MG/ML IV BOLUS
INTRAVENOUS | Status: AC
  Filled 2022-09-05: qty 20

## 2022-09-05 MED ORDER — CEFAZOLIN SODIUM-DEXTROSE 2-4 GM/100ML-% IV SOLN
2.0000 g | INTRAVENOUS | Status: AC
  Administered 2022-09-05: 2 g via INTRAVENOUS
  Filled 2022-09-05: qty 100

## 2022-09-05 MED ORDER — ONDANSETRON HCL 4 MG/2ML IJ SOLN
INTRAMUSCULAR | Status: AC
  Filled 2022-09-05: qty 2

## 2022-09-05 MED ORDER — DEXAMETHASONE SODIUM PHOSPHATE 10 MG/ML IJ SOLN
INTRAMUSCULAR | Status: AC
  Filled 2022-09-05: qty 1

## 2022-09-05 MED ORDER — BUPIVACAINE-EPINEPHRINE (PF) 0.25% -1:200000 IJ SOLN
INTRAMUSCULAR | Status: AC
  Filled 2022-09-05: qty 30

## 2022-09-05 MED ORDER — BUPIVACAINE LIPOSOME 1.3 % IJ SUSP: 20.0000 mL | Freq: Once | INTRAMUSCULAR | Status: DC

## 2022-09-05 MED ORDER — PROPOFOL 1000 MG/100ML IV EMUL
INTRAVENOUS | Status: AC
  Filled 2022-09-05: qty 100

## 2022-09-05 MED ORDER — OXYCODONE HCL 5 MG PO TABS
5.0000 mg | ORAL_TABLET | Freq: Once | ORAL | Status: AC | PRN
  Administered 2022-09-05: 5 mg via ORAL

## 2022-09-05 MED ORDER — OXYCODONE HCL 5 MG/5ML PO SOLN: 5.0000 mg | Freq: Once | ORAL | Status: AC | PRN

## 2022-09-05 MED ORDER — BUPIVACAINE-EPINEPHRINE 0.25% -1:200000 IJ SOLN
INTRAMUSCULAR | Status: DC | PRN
  Administered 2022-09-05: 30 mL

## 2022-09-05 MED ORDER — FENTANYL CITRATE PF 50 MCG/ML IJ SOSY
PREFILLED_SYRINGE | INTRAMUSCULAR | Status: AC
  Administered 2022-09-05: 25 ug via INTRAVENOUS
  Filled 2022-09-05: qty 1

## 2022-09-05 MED ORDER — SUGAMMADEX SODIUM 500 MG/5ML IV SOLN
INTRAVENOUS | Status: DC | PRN
  Administered 2022-09-05: 300 mg via INTRAVENOUS

## 2022-09-05 MED ORDER — ONDANSETRON HCL 4 MG/2ML IJ SOLN
INTRAMUSCULAR | Status: DC | PRN
  Administered 2022-09-05: 4 mg via INTRAVENOUS

## 2022-09-05 MED ORDER — PROPOFOL 10 MG/ML IV BOLUS
INTRAVENOUS | Status: DC | PRN
  Administered 2022-09-05: 100 mg via INTRAVENOUS

## 2022-09-05 MED ORDER — LIDOCAINE 2% (20 MG/ML) 5 ML SYRINGE
INTRAMUSCULAR | Status: DC | PRN
  Administered 2022-09-05: 100 mg via INTRAVENOUS

## 2022-09-05 MED ORDER — OXYCODONE HCL 5 MG PO TABS: 5.0000 mg | ORAL_TABLET | Freq: Four times a day (QID) | ORAL | 0 refills | Status: AC | PRN

## 2022-09-05 MED ORDER — PHENYLEPHRINE HCL-NACL 20-0.9 MG/250ML-% IV SOLN
INTRAVENOUS | Status: AC
  Filled 2022-09-05: qty 250

## 2022-09-05 SURGICAL SUPPLY — 39 items
APL PRP STRL LF DISP 70% ISPRP (MISCELLANEOUS) ×1
APL SWBSTK 6 STRL LF DISP (MISCELLANEOUS)
APPLICATOR COTTON TIP 6 STRL (MISCELLANEOUS) IMPLANT
APPLICATOR COTTON TIP 6IN STRL (MISCELLANEOUS)
BAG COUNTER SPONGE SURGICOUNT (BAG) IMPLANT
BAG SPNG CNTER NS LX DISP (BAG)
CHLORAPREP W/TINT 26 (MISCELLANEOUS) ×1 IMPLANT
COVER SURGICAL LIGHT HANDLE (MISCELLANEOUS) ×1 IMPLANT
COVER TIP SHEARS 8 DVNC (MISCELLANEOUS) ×1 IMPLANT
COVER TIP SHEARS 8MM DA VINCI (MISCELLANEOUS) ×1
DRAPE ARM DVNC X/XI (DISPOSABLE) ×4 IMPLANT
DRAPE COLUMN DVNC XI (DISPOSABLE) ×1 IMPLANT
DRAPE DA VINCI XI ARM (DISPOSABLE) ×4
DRAPE DA VINCI XI COLUMN (DISPOSABLE) ×1
ELECT REM PT RETURN 15FT ADLT (MISCELLANEOUS) ×1 IMPLANT
GLOVE BIOGEL PI IND STRL 7.0 (GLOVE) ×2 IMPLANT
GLOVE SURG SS PI 7.0 STRL IVOR (GLOVE) ×2 IMPLANT
GOWN STRL REUS W/ TWL LRG LVL3 (GOWN DISPOSABLE) ×2 IMPLANT
GOWN STRL REUS W/ TWL XL LVL3 (GOWN DISPOSABLE) IMPLANT
GOWN STRL REUS W/TWL LRG LVL3 (GOWN DISPOSABLE) ×2
GOWN STRL REUS W/TWL XL LVL3 (GOWN DISPOSABLE)
IRRIG SUCT STRYKERFLOW 2 WTIP (MISCELLANEOUS)
IRRIGATION SUCT STRKRFLW 2 WTP (MISCELLANEOUS) IMPLANT
KIT BASIN OR (CUSTOM PROCEDURE TRAY) ×1 IMPLANT
KIT TURNOVER KIT A (KITS) ×1 IMPLANT
MARKER SKIN DUAL TIP RULER LAB (MISCELLANEOUS) ×1 IMPLANT
MESH 3DMAX MID 4X6 RT LRG (Mesh General) IMPLANT
SEAL UNIV 5-12 XI (MISCELLANEOUS) ×3 IMPLANT
SEAL XI UNIVERSAL 5-12 (MISCELLANEOUS) ×3
SET TUBE SMOKE EVAC HIGH FLOW (TUBING) ×1 IMPLANT
SPIKE FLUID TRANSFER (MISCELLANEOUS) ×1 IMPLANT
SUT MNCRL AB 4-0 PS2 18 (SUTURE) ×1 IMPLANT
SUT VIC AB 2-0 SH 27 (SUTURE) ×1
SUT VIC AB 2-0 SH 27X BRD (SUTURE) ×1 IMPLANT
SUT VLOC BARB 180 ABS3/0GR12 (SUTURE)
SUTURE VLOC BRB 180 ABS3/0GR12 (SUTURE) IMPLANT
TOWEL OR 17X26 10 PK STRL BLUE (TOWEL DISPOSABLE) ×1 IMPLANT
TRAY LAPAROSCOPIC (CUSTOM PROCEDURE TRAY) ×1 IMPLANT
TROCAR Z-THREAD OPTICAL 5X100M (TROCAR) ×1 IMPLANT

## 2022-09-05 NOTE — H&P (Signed)
Chief Complaint: Inguinal Hernia   History of Present Illness: Cassandra Castillo is a 77 y.o. female who is seen today as an office consultation at the request of Dr. Donna Christen for evaluation of Inguinal Hernia .   She first noticed the hernia 3 months ago. Symptoms are occasional discomfort. She denies nausea or vomiting or bowel habit change.  She does not smoke She does not have diabetes She has no history of hernias  Review of Systems: A complete review of systems was obtained from the patient. I have reviewed this information and discussed as appropriate with the patient. See HPI as well for other ROS.  Review of Systems  Constitutional: Negative.  HENT: Negative.  Eyes: Negative.  Respiratory: Negative.  Cardiovascular: Negative.  Gastrointestinal: Negative.  Genitourinary: Negative.  Musculoskeletal: Negative.  Skin: Negative.  Neurological: Negative.  Endo/Heme/Allergies: Negative.  Psychiatric/Behavioral: Negative.   Medical History: Past Medical History:  Diagnosis Date  Hyperlipidemia  Hypertension  Thyroid disease   There is no problem list on file for this patient.  Past Surgical History:  Procedure Laterality Date  BREAST EXCISIONAL BIOPSY Left 1988  Left heart catheterization with coronary angiogram Left 10/21/2012  CHOLECYSTECTOMY  LAPAROSCOPIC TUBAL LIGATION  TONSILLECTOMY   Allergies  Allergen Reactions  Codeine Nausea And Vomiting  Other Other (See Comments)  Sensitive to pain and anesthesia medications   Current Outpatient Medications on File Prior to Visit  Medication Sig Dispense Refill  aspirin 81 MG EC tablet Take by mouth  atorvastatin (LIPITOR) 80 MG tablet Take 80 mg by mouth once daily  metoprolol tartrate (LOPRESSOR) 50 MG tablet Take 1 tablet by mouth 2 (two) times daily  nitroGLYcerin (NITROSTAT) 0.4 MG SL tablet Place under the tongue  cholecalciferol (VITAMIN D3) 1000 unit tablet Take by mouth  losartan-hydroCHLOROthiazide (HYZAAR)  50-12.5 mg tablet Take 1 tablet by mouth once daily   No current facility-administered medications on file prior to visit.   Family History  Problem Relation Age of Onset  Skin cancer Father  Coronary Artery Disease (Blocked arteries around heart) Father  Hyperlipidemia (Elevated cholesterol) Brother  Breast cancer Paternal Aunt  Coronary Artery Disease (Blocked arteries around heart) Paternal Uncle   Social History   Tobacco Use  Smoking Status Former  Types: Cigarettes  Quit date: 2014  Years since quitting: 10.1  Smokeless Tobacco Never   Social History   Socioeconomic History  Marital status: Married  Tobacco Use  Smoking status: Former  Types: Cigarettes  Quit date: 2014  Years since quitting: 10.1  Smokeless tobacco: Never  Substance and Sexual Activity  Alcohol use: Never  Drug use: Never   Objective:   Vitals:  08/15/22 1420  BP: (!) 148/84  Pulse: 70  Temp: 36.6 C (97.9 F)  SpO2: 99%  Weight: 78.2 kg (172 lb 6.4 oz)  Height: 162.6 cm ('5\' 4"'$ )  PainSc: 2   Body mass index is 29.59 kg/m.  Physical Exam Constitutional:  Appearance: Normal appearance.  HENT:  Head: Normocephalic and atraumatic.  Pulmonary:  Effort: Pulmonary effort is normal.  Abdominal:  Comments: Moderate right inguinal hernia  Musculoskeletal:  General: Normal range of motion.  Cervical back: Normal range of motion.  Neurological:  General: No focal deficit present.  Mental Status: She is alert and oriented to person, place, and time. Mental status is at baseline.  Psychiatric:  Mood and Affect: Mood normal.  Behavior: Behavior normal.  Thought Content: Thought content normal.     Labs, Imaging and Diagnostic Testing: I  reviewed CT scan images showing a right inguinal hernia containing small intestine without changes of inflammatory or obstruction  Assessment and Plan:  Diagnoses and all orders for this visit:  Non-recurrent unilateral inguinal hernia without  obstruction or gangrene    We discussed etiology of hernias and how they can cause pain. We discussed options for inguinal hernia repair vs observation. We discussed details of the surgery of general anesthesia, surgical approach and incisions, dissecting the sack away from structures in the area and nerves and placement of mesh. We discussed risks of bleeding, infection, recurrence, injury to structures in the area, nerve injury, and chronic pain. She showed good understanding and wanted proceed with minimally invasive right inguinal hernia repair as outpatient.

## 2022-09-05 NOTE — Anesthesia Procedure Notes (Signed)
Procedure Name: Intubation Date/Time: 09/05/2022 7:35 AM  Performed by: West Pugh, CRNAPre-anesthesia Checklist: Patient identified, Emergency Drugs available, Suction available, Patient being monitored and Timeout performed Patient Re-evaluated:Patient Re-evaluated prior to induction Oxygen Delivery Method: Circle system utilized Preoxygenation: Pre-oxygenation with 100% oxygen Induction Type: IV induction Ventilation: Mask ventilation without difficulty Laryngoscope Size: Mac and 4 Grade View: Grade I Tube type: Oral Tube size: 7.0 mm Number of attempts: 1 Airway Equipment and Method: Stylet Placement Confirmation: ETT inserted through vocal cords under direct vision, positive ETCO2, CO2 detector and breath sounds checked- equal and bilateral Secured at: 21 cm Tube secured with: Tape Dental Injury: Teeth and Oropharynx as per pre-operative assessment

## 2022-09-05 NOTE — Anesthesia Postprocedure Evaluation (Signed)
Anesthesia Post Note  Patient: Cassandra Castillo  Procedure(s) Performed: XI ROBOTIC ASSISTED RIGHT INGUINAL HERNIA REPAIR WITH MESH (Right)     Patient location during evaluation: PACU Anesthesia Type: General Level of consciousness: awake Pain management: pain level controlled Vital Signs Assessment: post-procedure vital signs reviewed and stable Respiratory status: spontaneous breathing, nonlabored ventilation and respiratory function stable Cardiovascular status: blood pressure returned to baseline and stable Postop Assessment: no apparent nausea or vomiting Anesthetic complications: no   No notable events documented.  Last Vitals:  Vitals:   09/05/22 1000 09/05/22 1015  BP: 133/65 (!) 147/62  Pulse: (!) 54 (!) 54  Resp: 15 16  Temp:  (!) 36.4 C  SpO2: 95% 97%    Last Pain:  Vitals:   09/05/22 1015  TempSrc:   PainSc: 3                  Nilda Simmer

## 2022-09-05 NOTE — Transfer of Care (Signed)
Immediate Anesthesia Transfer of Care Note  Patient: Cassandra Castillo  Procedure(s) Performed: XI ROBOTIC ASSISTED RIGHT INGUINAL HERNIA REPAIR WITH MESH (Right)  Patient Location: PACU  Anesthesia Type:General  Level of Consciousness: drowsy and patient cooperative  Airway & Oxygen Therapy: Patient Spontanous Breathing and Patient connected to face mask oxygen  Post-op Assessment: Report given to RN and Post -op Vital signs reviewed and stable  Post vital signs: Reviewed and stable  Last Vitals:  Vitals Value Taken Time  BP 118/59 09/05/22 0900  Temp    Pulse 55 09/05/22 0902  Resp 16 09/05/22 0902  SpO2 100 % 09/05/22 0902  Vitals shown include unvalidated device data.  Last Pain:  Vitals:   09/05/22 0600  TempSrc: Oral  PainSc:          Complications: No notable events documented.

## 2022-09-05 NOTE — Discharge Instructions (Signed)
[facility] _______[facility]  UMBILICAL OR INGUINAL HERNIA REPAIR: POST OP INSTRUCTIONS  Always review your discharge instruction sheet given to you by the facility where your surgery was performed. IF YOU HAVE DISABILITY OR FAMILY LEAVE FORMS, YOU MUST BRING THEM TO THE OFFICE FOR PROCESSING.   DO NOT GIVE THEM TO YOUR DOCTOR.  1. A  prescription for pain medication may be given to you upon discharge.  Take your pain medication as prescribed, if needed.  If narcotic pain medicine is not needed, then you may take acetaminophen (Tylenol) or ibuprofen (Advil) as needed. 2. Take your usually prescribed medications unless otherwise directed. If you need a refill on your pain medication, please contact your pharmacy.  They will contact our office to request authorization. Prescriptions will not be filled after 5 pm or on week-ends. 3. You should follow a light diet the first 24 hours after arrival home, such as soup and crackers, etc.  Be sure to include lots of fluids daily.  Resume your normal diet the day after surgery. 4.Most patients will experience some swelling and bruising around the umbilicus or in the groin and scrotum.  Ice packs and reclining will help.  Swelling and bruising can take several days to resolve.  6. It is common to experience some constipation if taking pain medication after surgery.  Increasing fluid intake and taking a stool softener (such as Colace) will usually help or prevent this problem from occurring.  A mild laxative (Milk of Magnesia or Miralax) should be taken according to package directions if there are no bowel movements after 48 hours. 7. Unless discharge instructions indicate otherwise, you may remove your bandages 24-48 hours after surgery, and you may shower at that time.  You may have steri-strips (small skin tapes) in place directly over the incision.  These strips should be left on the skin for 7-10 days.  If your surgeon used skin glue on the  incision, you may shower in 24 hours.  The glue will flake off over the next 2-3 weeks.  Any sutures or staples will be removed at the office during your follow-up visit. 8. ACTIVITIES:  You may resume regular (light) daily activities beginning the next day--such as daily self-care, walking, climbing stairs--gradually increasing activities as tolerated.  You may have sexual intercourse when it is comfortable.  Refrain from any heavy lifting or straining until approved by your doctor.  a.You may drive when you are no longer taking prescription pain medication, you can comfortably wear a seatbelt, and you can safely maneuver your car and apply brakes. b.RETURN TO WORK:   _____________________________________________  9.You should see your doctor in the office for a follow-up appointment approximately 2-3 weeks after your surgery.  Make sure that you call for this appointment within a day or two after you arrive home to insure a convenient appointment time. 10.OTHER INSTRUCTIONS: _________________________    _____________________________________  WHEN TO CALL YOUR DOCTOR: Fever over 101.0 Inability to urinate Nausea and/or vomiting Extreme swelling or bruising Continued bleeding from incision. Increased pain, redness, or drainage from the incision  The clinic staff is available to answer your questions during regular business hours.  Please don't hesitate to call and ask to speak to one of the nurses for clinical concerns.  If you have a medical emergency, go to the nearest emergency room or call 911.  A surgeon from [facility] is always on call at the hospital   [address], [address],   [place], [place]  [address] ?  [address] [address], [place], [place]   [address] [phone] ? [phone] ? FAX [phone] Web site: [email or web address]  

## 2022-09-05 NOTE — Op Note (Addendum)
Preop diagnosis: right inguinal hernia  Postop diagnosis: right indirect inguinal hernia  Procedure: Robotic  Right inguinal hernia repair with mesh  Surgeon: Gurney Maxin, M.D.  Asst: none  Anesthesia: Gen.   Indications for procedure: Cassandra Castillo is a 77 y.o. female with symptoms of pain and enlarging Right inguinal hernia(s). After discussing risks, alternatives and benefits she decided on robotic repair and was brought to day surgery for repair.  Description of procedure: The patient was brought into the operative suite, placed supine. Anesthesia was administered with endotracheal tube. Patient was strapped in place. The patient was prepped and draped in the usual sterile fashion.  A small transverse incision was made just left of midline about 20 cm cephalad of the pubic symphysis. A 82m trocar was used to gain access to the peritoneal cavity by optical entry technique. Pneumoperitoneum was applied with a high flow and low pressure. The laparoscope was reinserted to confirm position.  Next Exparel:Marcaine mix was used for bilateral TAP blocks. 1 8 mm trocar was placed in the right mid abdomen. 1 8 mm trocar was placed in the left mid abdomen. The 5 mm trocar was upsized to an 8 mm trocar. The patient was placed in trendelenberg position. The robot was docked.  On initial visualization , there was a large indirect right inguinal hernia. A peritoneal flap was created on the right. This was continued medially to the pubic bone medially and laterally to muscles. Hernia sac was completely dissected out of the canal. The round ligament was divided to fully remove contents from the hernia.  A large right mid weight 3D max mesh was inserted and sutured with 2-0 vicryl medially to the lacunar ligament. The mesh was positioned flat and directly up against the direct and indirect areas. The peritoneal flap was sewn back up with running 3-0 v loc suture.  The CO2 was evacuated while watching to  ensure the mesh did not migrate. All skin incisions were closed with 4-0 monocryl subcu stitch. The patient awoke from anesthesia and was brought to PACU in stable condition.  Findings: right indirect inguinal hernia  Specimen: none  Blood loss: 20 ml  Local anesthesia: 50 ml Exparel:Marcaine mix  Complications: none  Implant: right large mid weight Bard 3D max mesh  LGurney Maxin M.D. General, Bariatric, & Minimally Invasive Surgery [facility] PUtah9:04 AM 09/05/2022

## 2022-09-06 ENCOUNTER — Encounter (HOSPITAL_COMMUNITY): Payer: Self-pay | Admitting: General Surgery

## 2022-09-27 DIAGNOSIS — Z86010 Personal history of colonic polyps: Secondary | ICD-10-CM | POA: Diagnosis not present

## 2022-09-27 DIAGNOSIS — D125 Benign neoplasm of sigmoid colon: Secondary | ICD-10-CM | POA: Diagnosis not present

## 2022-09-27 DIAGNOSIS — D120 Benign neoplasm of cecum: Secondary | ICD-10-CM | POA: Diagnosis not present

## 2022-09-27 DIAGNOSIS — Z09 Encounter for follow-up examination after completed treatment for conditions other than malignant neoplasm: Secondary | ICD-10-CM | POA: Diagnosis not present

## 2022-09-27 DIAGNOSIS — K649 Unspecified hemorrhoids: Secondary | ICD-10-CM | POA: Diagnosis not present

## 2022-09-27 DIAGNOSIS — K5730 Diverticulosis of large intestine without perforation or abscess without bleeding: Secondary | ICD-10-CM | POA: Diagnosis not present

## 2022-10-01 DIAGNOSIS — D125 Benign neoplasm of sigmoid colon: Secondary | ICD-10-CM | POA: Diagnosis not present

## 2022-10-01 DIAGNOSIS — E7800 Pure hypercholesterolemia, unspecified: Secondary | ICD-10-CM | POA: Diagnosis not present

## 2022-10-01 DIAGNOSIS — Z0000 Encounter for general adult medical examination without abnormal findings: Secondary | ICD-10-CM | POA: Diagnosis not present

## 2022-10-01 DIAGNOSIS — Z1283 Encounter for screening for malignant neoplasm of skin: Secondary | ICD-10-CM | POA: Diagnosis not present

## 2022-10-01 DIAGNOSIS — D120 Benign neoplasm of cecum: Secondary | ICD-10-CM | POA: Diagnosis not present

## 2022-10-01 DIAGNOSIS — I10 Essential (primary) hypertension: Secondary | ICD-10-CM | POA: Diagnosis not present

## 2022-10-01 DIAGNOSIS — E559 Vitamin D deficiency, unspecified: Secondary | ICD-10-CM | POA: Diagnosis not present

## 2022-12-24 DIAGNOSIS — L209 Atopic dermatitis, unspecified: Secondary | ICD-10-CM | POA: Diagnosis not present

## 2022-12-24 DIAGNOSIS — L578 Other skin changes due to chronic exposure to nonionizing radiation: Secondary | ICD-10-CM | POA: Diagnosis not present

## 2022-12-24 DIAGNOSIS — D1801 Hemangioma of skin and subcutaneous tissue: Secondary | ICD-10-CM | POA: Diagnosis not present

## 2022-12-24 DIAGNOSIS — L570 Actinic keratosis: Secondary | ICD-10-CM | POA: Diagnosis not present

## 2022-12-24 DIAGNOSIS — L814 Other melanin hyperpigmentation: Secondary | ICD-10-CM | POA: Diagnosis not present

## 2022-12-24 DIAGNOSIS — L821 Other seborrheic keratosis: Secondary | ICD-10-CM | POA: Diagnosis not present

## 2023-01-23 NOTE — Progress Notes (Signed)
[name], female   DOB: 1946/05/02, 77 y.o.   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  [name] is OB/GYN doc in town. She has quit smoking Normal myovue in June 2021 EF 60%   Had significant bleed post colonoscopy 03/25/20 requiring transfusion and IR embolization of the vasa recta branches of the distal right colic artery after polypectomy   She likes to travel a lot Doing less steps than before but still > 10,000  Husband passed away after battling renal failure We did TAVR on him before he passed away  She had uncomplicated right inguinal hernia repair 09/05/22   Nice beach trip with family to [place] this summer   ROS: Denies fever, malais, weight loss, blurry vision, decreased visual acuity, cough, sputum, SOB, hemoptysis, pleuritic pain, palpitaitons, heartburn, abdominal pain, melena, lower extremity edema, claudication, or rash.  All other systems reviewed and negative  General: BP 136/60   Pulse (!) 55   Ht 5\' 4"  (1.626 m)   Wt 162 lb 9.6 oz (73.8 kg)   SpO2 93%   BMI 27.91 kg/m  Affect appropriate Overweight white female  HEENT: poor dentition  Neck supple with no adenopathy JVP normal no bruits no thyromegaly Lungs clear with no wheezing and good diaphragmatic motion Heart:  S1/S2 no murmur, no rub, gallop or click PMI normal Abdomen: benighn, BS positve, no tenderness, no AAA Post right inguinal hernia repair  no bruit.  No HSM or HJR Distal pulses intact with no bruits No edema Neuro non-focal Skin warm and dry No muscular weakness    Current Outpatient Medications  Medication Sig Dispense Refill   aspirin 81 MG EC tablet Take 1 tablet (81 mg total) by mouth daily. 30 tablet 1   atorvastatin (LIPITOR) 80 MG tablet Take 1 tablet (80 mg total) by mouth daily at 6 PM. 30 tablet 1   Cholecalciferol (VITAMIN D)  50 MCG (2000 UT) CAPS Take 4,000 Units by mouth daily.     ibuprofen (ADVIL) 800 MG tablet Take 1 tablet (800 mg total) by mouth every 8 (eight) hours as needed. 30 tablet 0   losartan-hydrochlorothiazide (HYZAAR) 50-12.5 MG tablet TAKE 1 TABLET BY MOUTH EVERY DAY 90 tablet 3   Magnesium 400 MG TABS Take 400 mg by mouth daily.     metoprolol tartrate (LOPRESSOR) 50 MG tablet Take 1 tablet by mouth twice daily 180 tablet 2   Multiple Vitamins-Minerals (CENTRUM SILVER PO) Take 1 tablet by mouth daily.     nitroGLYCERIN (NITROSTAT) 0.4 MG SL tablet Place 1 tablet (0.4 mg total) under the tongue every 5 (five) minutes as needed for chest pain (up to 3 times). 25 tablet 2   oxyCODONE (OXY IR/ROXICODONE) 5 MG immediate release tablet Take 1 tablet (5 mg total) by mouth every 6 (six) hours as needed for severe pain. (Patient not taking: Reported on 01/29/2023) 15 tablet 0   No current facility-administered medications for this visit.    Allergies  Codeine and Other  Electrocardiogram:   04/22/18 SR rate 59 low voltage normal  05/06/19 SR rate 50 normal   Assessment and Plan  HTN:  Improved with ARB rand diuretic low sodium DASH type diet  CAD: Had RCA stenting mid/distal vessel 2014 Angina is atypical  Myovue normal 12/02/19 EF 60% no ischemia Continue medical Rx  Off DAT now    Chol:  On statin labs with Dr Tiburcio Pea   GI:   F/U colonoscopy with Dr Ewing Schlein   F/U in a year    Charlton Haws

## 2023-01-29 ENCOUNTER — Ambulatory Visit: Payer: Medicare Other | Attending: Cardiovascular Disease | Admitting: Cardiovascular Disease

## 2023-01-29 ENCOUNTER — Encounter: Payer: Self-pay | Admitting: Cardiovascular Disease

## 2023-01-29 VITALS — BP 136/60 | HR 55 | Ht 64.0 in | Wt 162.6 lb

## 2023-01-29 DIAGNOSIS — I10 Essential (primary) hypertension: Secondary | ICD-10-CM | POA: Diagnosis not present

## 2023-01-29 DIAGNOSIS — R001 Bradycardia, unspecified: Secondary | ICD-10-CM | POA: Insufficient documentation

## 2023-01-29 DIAGNOSIS — I2510 Atherosclerotic heart disease of native coronary artery without angina pectoris: Secondary | ICD-10-CM | POA: Diagnosis not present

## 2023-01-29 DIAGNOSIS — E785 Hyperlipidemia, unspecified: Secondary | ICD-10-CM | POA: Insufficient documentation

## 2023-01-29 NOTE — Patient Instructions (Signed)

## 2023-02-09 ENCOUNTER — Other Ambulatory Visit: Payer: Self-pay | Admitting: Cardiovascular Disease

## 2023-04-02 DIAGNOSIS — E7800 Pure hypercholesterolemia, unspecified: Secondary | ICD-10-CM | POA: Diagnosis not present

## 2023-04-02 DIAGNOSIS — G4489 Other headache syndrome: Secondary | ICD-10-CM | POA: Diagnosis not present

## 2023-04-02 DIAGNOSIS — I10 Essential (primary) hypertension: Secondary | ICD-10-CM | POA: Diagnosis not present

## 2023-04-02 DIAGNOSIS — M79672 Pain in left foot: Secondary | ICD-10-CM | POA: Diagnosis not present

## 2023-04-02 DIAGNOSIS — E559 Vitamin D deficiency, unspecified: Secondary | ICD-10-CM | POA: Diagnosis not present

## 2023-04-07 ENCOUNTER — Other Ambulatory Visit: Payer: Self-pay | Admitting: Cardiovascular Disease

## 2023-04-11 ENCOUNTER — Ambulatory Visit (INDEPENDENT_AMBULATORY_CARE_PROVIDER_SITE_OTHER): Payer: Medicare Other | Admitting: Podiatry

## 2023-04-11 ENCOUNTER — Ambulatory Visit (INDEPENDENT_AMBULATORY_CARE_PROVIDER_SITE_OTHER): Payer: Medicare Other

## 2023-04-11 ENCOUNTER — Encounter: Payer: Self-pay | Admitting: Podiatry

## 2023-04-11 VITALS — Ht 64.0 in | Wt 162.0 lb

## 2023-04-11 DIAGNOSIS — M21612 Bunion of left foot: Secondary | ICD-10-CM | POA: Diagnosis not present

## 2023-04-11 DIAGNOSIS — M21611 Bunion of right foot: Secondary | ICD-10-CM

## 2023-04-11 DIAGNOSIS — M7750 Other enthesopathy of unspecified foot: Secondary | ICD-10-CM

## 2023-04-11 NOTE — Progress Notes (Unsigned)
Subjective:  [name], female    DOB: 1946-05-07,  MRN: [id number]    Discussed the use of AI scribe software for clinical note transcription with the patient, who gave verbal consent to proceed.  History of Present Illness         77 year old female with a history of a stress fracture in the left foot  presents with bilateral foot pain, predominantly on the left side. They attribute the discomfort to a new pair of shoes purchased last spring. Despite being fitted for the shoes, the patient describes the pain as 'like it's on fire.' The discomfort is exacerbated by certain shoes and relieved by wearing old shoes or loose footwear like flip flops. The patient is highly active, walking ten miles a day. They deny recent injuries, swelling, or other symptoms.     Objective:    Physical Exam         General: AAO x3, NAD  Dermatological: Skin is warm, dry and supple bilateral.  There are no open sores, no preulcerative lesions, no rash or signs of infection present.  Vascular: Dorsalis Pedis artery and Posterior Tibial artery pedal pulses are 2/4 bilateral with immedate capillary fill time. There is no pain with calf compression, swelling, warmth, erythema.   Neruologic: Grossly intact via light touch bilateral.  Musculoskeletal:Tenderness on the lateral aspect of the foot, specifically at the base of the fifth metatarsal. Prominent knot on the left foot, more prominent than on the right, suggesting inflammation. Mild tenderness in the arch area. Muscular strength 5/5 in all groups tested bilateral.  Gait: Unassisted, Nonantalgic.    No images are attached to the encounter.    Results          Assessment:  No diagnosis found.   Plan:  Patient was evaluated and treated and all questions answered.  Assessment and Plan          Foot Pain Pain localized to the lateral aspect of the left foot, particularly at the base of the fifth metatarsal. No recent injuries,  swelling, or systemic symptoms. Noted a prominent knot at the base of the fifth metatarsal, possibly inflamed. Pain seems to be exacerbated by certain shoes. -Offered steroid injection.  Discussed topical medication.  Discussed offloading shoe modifications. -Return to clinic for follow-up after imaging results are available.  Radiology: X-rays obtained reviewed bilaterally.  3 views of each foot were obtained. Previous fracture noted along the left third metatarsal.  Decreased calcaneal condition angle bilaterally.  Calcaneal spurring present.  Digital deformity present.  No follow-ups on file.    Vivi Barrack DPM

## 2023-04-14 ENCOUNTER — Telehealth: Payer: Self-pay | Admitting: Cardiology

## 2023-04-14 NOTE — Telephone Encounter (Signed)
Dr Eden Emms, Ms Harper was referred to our Lp(a) trial by her PCP.  Her initial Lp(a) was 274 nmol/L.    Patient has been randomized into the Lepodisiran on the Reduction of MACE w/ Elevated Lp(a) in Established CVD or High Risk for CVD (J3L-MC-EZEF-ACCLAIM-Lp(a)).    She has been randomized 1:1 to lepodisiran vs placebo.     Dr Jacinto Halim is the Primary Investigator in this study with medication management.   Thanks Hazle Nordmann, PharmD, CPP [facility] [phone]

## 2023-07-28 ENCOUNTER — Other Ambulatory Visit: Payer: Self-pay | Admitting: Gastroenterology

## 2023-07-28 DIAGNOSIS — H4321 Crystalline deposits in vitreous body, right eye: Secondary | ICD-10-CM | POA: Diagnosis not present

## 2023-07-28 DIAGNOSIS — H40013 Open angle with borderline findings, low risk, bilateral: Secondary | ICD-10-CM | POA: Diagnosis not present

## 2023-07-28 DIAGNOSIS — H5713 Ocular pain, bilateral: Secondary | ICD-10-CM | POA: Diagnosis not present

## 2023-07-28 DIAGNOSIS — K769 Liver disease, unspecified: Secondary | ICD-10-CM

## 2023-07-28 DIAGNOSIS — H25813 Combined forms of age-related cataract, bilateral: Secondary | ICD-10-CM | POA: Diagnosis not present

## 2023-07-31 ENCOUNTER — Other Ambulatory Visit: Payer: Self-pay | Admitting: Family Medicine

## 2023-07-31 DIAGNOSIS — Z1231 Encounter for screening mammogram for malignant neoplasm of breast: Secondary | ICD-10-CM

## 2023-08-13 ENCOUNTER — Ambulatory Visit: Payer: Medicare Other

## 2023-08-22 ENCOUNTER — Ambulatory Visit
Admission: RE | Admit: 2023-08-22 | Discharge: 2023-08-22 | Disposition: A | Discharge status: Home / Self Care | Payer: Medicare Other | Source: Ambulatory Visit | Attending: Family Medicine | Admitting: Family Medicine

## 2023-08-22 DIAGNOSIS — Z1231 Encounter for screening mammogram for malignant neoplasm of breast: Secondary | ICD-10-CM | POA: Diagnosis not present

## 2023-09-04 ENCOUNTER — Ambulatory Visit
Admission: RE | Admit: 2023-09-04 | Discharge: 2023-09-04 | Disposition: A | Discharge status: Home / Self Care | Payer: Medicare Other | Source: Ambulatory Visit | Attending: Gastroenterology | Admitting: Gastroenterology

## 2023-09-04 DIAGNOSIS — K769 Liver disease, unspecified: Secondary | ICD-10-CM

## 2023-09-04 DIAGNOSIS — I700 Atherosclerosis of aorta: Secondary | ICD-10-CM | POA: Diagnosis not present

## 2023-09-04 DIAGNOSIS — K7689 Other specified diseases of liver: Secondary | ICD-10-CM | POA: Diagnosis not present

## 2023-09-04 MED ORDER — GADOPICLENOL 0.5 MMOL/ML IV SOLN
7.0000 mL | Freq: Once | INTRAVENOUS | Status: AC | PRN
  Administered 2023-09-04: 7 mL via INTRAVENOUS

## 2023-10-09 DIAGNOSIS — E7800 Pure hypercholesterolemia, unspecified: Secondary | ICD-10-CM | POA: Diagnosis not present

## 2023-10-09 DIAGNOSIS — Z0000 Encounter for general adult medical examination without abnormal findings: Secondary | ICD-10-CM | POA: Diagnosis not present

## 2023-10-09 DIAGNOSIS — I10 Essential (primary) hypertension: Secondary | ICD-10-CM | POA: Diagnosis not present

## 2024-01-01 DIAGNOSIS — L570 Actinic keratosis: Secondary | ICD-10-CM | POA: Diagnosis not present

## 2024-01-01 DIAGNOSIS — D485 Neoplasm of uncertain behavior of skin: Secondary | ICD-10-CM | POA: Diagnosis not present

## 2024-01-01 DIAGNOSIS — L578 Other skin changes due to chronic exposure to nonionizing radiation: Secondary | ICD-10-CM | POA: Diagnosis not present

## 2024-01-01 DIAGNOSIS — L814 Other melanin hyperpigmentation: Secondary | ICD-10-CM | POA: Diagnosis not present

## 2024-01-01 DIAGNOSIS — L821 Other seborrheic keratosis: Secondary | ICD-10-CM | POA: Diagnosis not present

## 2024-01-01 DIAGNOSIS — C44629 Squamous cell carcinoma of skin of left upper limb, including shoulder: Secondary | ICD-10-CM | POA: Diagnosis not present

## 2024-01-13 DIAGNOSIS — D0462 Carcinoma in situ of skin of left upper limb, including shoulder: Secondary | ICD-10-CM | POA: Diagnosis not present

## 2024-01-13 DIAGNOSIS — C44629 Squamous cell carcinoma of skin of left upper limb, including shoulder: Secondary | ICD-10-CM | POA: Diagnosis not present

## 2024-02-13 ENCOUNTER — Other Ambulatory Visit: Payer: Self-pay

## 2024-02-16 MED ORDER — LOSARTAN POTASSIUM-HCTZ 50-12.5 MG PO TABS: 1.0000 | ORAL_TABLET | Freq: Every day | ORAL | 0 refills | Status: DC

## 2024-03-12 ENCOUNTER — Other Ambulatory Visit: Payer: Self-pay | Admitting: Cardiovascular Disease

## 2024-03-25 ENCOUNTER — Other Ambulatory Visit: Payer: Self-pay | Admitting: Cardiovascular Disease

## 2024-04-01 DIAGNOSIS — E7800 Pure hypercholesterolemia, unspecified: Secondary | ICD-10-CM | POA: Diagnosis not present

## 2024-04-01 DIAGNOSIS — E559 Vitamin D deficiency, unspecified: Secondary | ICD-10-CM | POA: Diagnosis not present

## 2024-04-01 DIAGNOSIS — I10 Essential (primary) hypertension: Secondary | ICD-10-CM | POA: Diagnosis not present

## 2024-04-01 DIAGNOSIS — I2510 Atherosclerotic heart disease of native coronary artery without angina pectoris: Secondary | ICD-10-CM | POA: Diagnosis not present

## 2024-04-06 NOTE — Progress Notes (Signed)
 [name], female   DOB: 04-06-1946, 78 y.o.   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  Daughter Jazmen Lindenbaum is OB/GYN doc in town. She has quit smoking Normal myovue in June 2021 EF 60%   Had significant bleed post colonoscopy 03/25/20 requiring transfusion and IR embolization of the vasa recta branches of the distal right colic artery after polypectomy   She likes to travel a lot Doing less steps than before but still > 10,000  Husband passed away after battling renal failure We did TAVR on him before he passed away  She had uncomplicated right inguinal hernia repair 09/05/22   Nice beach trip with family to [place] this summer She is going to [place]  next week to see another daughter   ROS: Denies fever, malais, weight loss, blurry vision, decreased visual acuity, cough, sputum, SOB, hemoptysis, pleuritic pain, palpitaitons, heartburn, abdominal pain, melena, lower extremity edema, claudication, or rash.  All other systems reviewed and negative  General: There were no vitals taken for this visit. Affect appropriate Overweight white female  HEENT: poor dentition  Neck supple with no adenopathy JVP normal no bruits no thyromegaly Lungs clear with no wheezing and good diaphragmatic motion Heart:  S1/S2 no murmur, no rub, gallop or click PMI normal Abdomen: benighn, BS positve, no tenderness, no AAA Post right inguinal hernia repair  no bruit.  No HSM or HJR Distal pulses intact with no bruits No edema Neuro non-focal Skin warm and dry No muscular weakness    Current Outpatient Medications  Medication Sig Dispense Refill   aspirin  81 MG EC tablet Take 1 tablet (81 mg total) by mouth daily. 30 tablet 1   atorvastatin  (LIPITOR ) 80 MG tablet Take 1 tablet (80 mg total) by mouth daily at 6 PM. 30 tablet 1   Cholecalciferol (VITAMIN D) 50 MCG  (2000 UT) CAPS Take 4,000 Units by mouth daily.     ibuprofen  (ADVIL ) 800 MG tablet Take 1 tablet (800 mg total) by mouth every 8 (eight) hours as needed. 30 tablet 0   Investigational - Study Medication Study name: Lepodisiran on the Reduction of MACE w/ Elevated Lp(a) in Established CVD or High Risk for CVD ([id number]-ACCLAIM-Lp(a)) Additional study details: [employer] [phone]     losartan -hydrochlorothiazide  (HYZAAR) 50-12.5 MG tablet Take 1 tablet by mouth daily. Please call [phone] to schedule an overdue appointment with Dr. Maude Emmer for future refills. Thank you. 3rd and FINAL attempt 7 tablet 0   Magnesium  400 MG TABS Take 400 mg by mouth daily.     metoprolol  tartrate (LOPRESSOR ) 50 MG tablet Take 1 tablet by mouth twice daily 180 tablet 3   Multiple Vitamins-Minerals (CENTRUM SILVER PO) Take 1 tablet by mouth daily.     nitroGLYCERIN  (NITROSTAT ) 0.4 MG SL tablet Place 1 tablet (0.4 mg total) under the tongue every 5 (five) minutes as needed for chest pain (up to 3 times). 25 tablet 2   oxyCODONE  (OXY IR/ROXICODONE ) 5 MG immediate release tablet Take 1 tablet (5 mg total) by mouth every 6 (six) hours as needed for severe pain. 15 tablet 0   No current facility-administered medications for this visit.    Allergies  Codeine and Other  Electrocardiogram:   04/22/18 SR rate 59 low voltage normal  05/06/19 SR rate 50 normal   Assessment and Plan  HTN:  Improved with ARB rand diuretic low sodium DASH type diet  CAD: Had RCA stenting mid/distal vessel 2014 Angina is atypical  Myovue normal 12/02/19 EF 60% no ischemia Continue medical Rx Off DAT now    Chol:  On statin. Enrolled in Lepodisiran research trial for elevated LpA  GI:   F/U colonoscopy with Dr Rosalie   F/U in a year    Maude Emmer

## 2024-04-07 ENCOUNTER — Other Ambulatory Visit: Payer: Self-pay | Admitting: Cardiovascular Disease

## 2024-04-15 ENCOUNTER — Ambulatory Visit: Attending: Internal Medicine | Admitting: Cardiovascular Disease

## 2024-04-15 ENCOUNTER — Encounter: Payer: Self-pay | Admitting: Cardiovascular Disease

## 2024-04-15 VITALS — BP 134/72 | HR 53 | Resp 16 | Ht 64.0 in | Wt 151.2 lb

## 2024-04-15 DIAGNOSIS — I2510 Atherosclerotic heart disease of native coronary artery without angina pectoris: Secondary | ICD-10-CM | POA: Insufficient documentation

## 2024-04-15 DIAGNOSIS — E785 Hyperlipidemia, unspecified: Secondary | ICD-10-CM | POA: Diagnosis not present

## 2024-04-15 DIAGNOSIS — I10 Essential (primary) hypertension: Secondary | ICD-10-CM | POA: Diagnosis not present

## 2024-04-15 NOTE — Patient Instructions (Signed)
 Medication Instructions:   No changes *If you need a refill on your cardiac medications before your next appointment, please call your pharmacy*   Lab Work: Not needed If you have labs (blood work) drawn today and your tests are completely normal, you will receive your results only by: MyChart Message (if you have MyChart) OR A paper copy in the mail If you have any lab test that is abnormal or we need to change your treatment, we will call you to review the results.   Testing/Procedures: Not needed   Follow-Up: At [facility], you and your health needs are our priority.  As part of our continuing mission to provide you with exceptional heart care, we have created designated Provider Care Teams.  These Care Teams include your primary Cardiologist (physician) and Advanced Practice Providers (APPs -  Physician Assistants and Nurse Practitioners) who all work together to provide you with the care you need, when you need it.     Your next appointment:   12 month(s)  The format for your next appointment:   In Person  Provider:   Maude Emmer, MD

## 2024-04-16 ENCOUNTER — Encounter: Payer: Self-pay | Admitting: Cardiovascular Disease

## 2024-04-16 ENCOUNTER — Other Ambulatory Visit: Payer: Self-pay

## 2024-04-16 MED ORDER — LOSARTAN POTASSIUM-HCTZ 50-12.5 MG PO TABS: 1.0000 | ORAL_TABLET | Freq: Every day | ORAL | 3 refills | Status: AC

## 2024-04-16 NOTE — Telephone Encounter (Signed)
 RX sent in
